# Patient Record
Sex: Female | Born: 1940 | Race: White | Hispanic: No | Smoking: Current every day smoker
Health system: Southern US, Community
[De-identification: ages and names within clinical notes are randomized; demographics above are authoritative.]

## PROBLEM LIST (undated history)

## (undated) DIAGNOSIS — Z8619 Personal history of other infectious and parasitic diseases: Secondary | ICD-10-CM

## (undated) DIAGNOSIS — F419 Anxiety disorder, unspecified: Secondary | ICD-10-CM

## (undated) DIAGNOSIS — Z720 Tobacco use: Secondary | ICD-10-CM

## (undated) DIAGNOSIS — E785 Hyperlipidemia, unspecified: Secondary | ICD-10-CM

## (undated) DIAGNOSIS — F32A Depression, unspecified: Secondary | ICD-10-CM

## (undated) DIAGNOSIS — T7840XA Allergy, unspecified, initial encounter: Secondary | ICD-10-CM

## (undated) DIAGNOSIS — I10 Essential (primary) hypertension: Secondary | ICD-10-CM

## (undated) DIAGNOSIS — F329 Major depressive disorder, single episode, unspecified: Secondary | ICD-10-CM

## (undated) HISTORY — DX: Allergy, unspecified, initial encounter: T78.40XA

## (undated) HISTORY — PX: CARDIAC CATHETERIZATION: SHX172

## (undated) HISTORY — PX: TONSILLECTOMY: SUR1361

## (undated) HISTORY — DX: Hyperlipidemia, unspecified: E78.5

## (undated) HISTORY — PX: OTHER SURGICAL HISTORY: SHX169

## (undated) HISTORY — DX: Essential (primary) hypertension: I10

## (undated) HISTORY — DX: Anxiety disorder, unspecified: F41.9

## (undated) HISTORY — DX: Personal history of other infectious and parasitic diseases: Z86.19

## (undated) HISTORY — DX: Tobacco use: Z72.0

## (undated) HISTORY — DX: Depression, unspecified: F32.A

---
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
## 1976-03-11 HISTORY — PX: ABDOMINAL HYSTERECTOMY: SHX81

## 2002-10-04 DIAGNOSIS — R002 Palpitations: Secondary | ICD-10-CM | POA: Insufficient documentation

## 2002-10-04 DIAGNOSIS — I493 Ventricular premature depolarization: Secondary | ICD-10-CM | POA: Insufficient documentation

## 2002-11-04 DIAGNOSIS — R072 Precordial pain: Secondary | ICD-10-CM | POA: Insufficient documentation

## 2002-11-04 DIAGNOSIS — R9430 Abnormal result of cardiovascular function study, unspecified: Secondary | ICD-10-CM | POA: Insufficient documentation

## 2015-11-16 DIAGNOSIS — F17200 Nicotine dependence, unspecified, uncomplicated: Secondary | ICD-10-CM | POA: Insufficient documentation

## 2017-12-31 LAB — CBC AND DIFFERENTIAL
HCT: 42 (ref 36–46)
Hemoglobin: 13.9 (ref 12.0–16.0)
Platelets: 247 (ref 150–399)
WBC: 9.1

## 2017-12-31 LAB — COMPREHENSIVE METABOLIC PANEL
Albumin: 4.1 (ref 3.5–5.0)
Calcium: 9.6 (ref 8.7–10.7)
GFR calc Af Amer: 58
GFR calc non Af Amer: 50

## 2017-12-31 LAB — HEPATIC FUNCTION PANEL
ALT: 11 (ref 7–35)
AST: 13 (ref 13–35)
Alkaline Phosphatase: 72 (ref 25–125)
Bilirubin, Total: 0.4

## 2017-12-31 LAB — BASIC METABOLIC PANEL
BUN: 8 (ref 4–21)
Creatinine: 1.1 (ref 0.5–1.1)
Glucose: 118
Potassium: 4.4 (ref 3.4–5.3)
Sodium: 138 (ref 137–147)

## 2017-12-31 LAB — LIPID PANEL
Cholesterol: 216 — AB (ref 0–200)
HDL: 35 (ref 35–70)
LDL Cholesterol: 132
Triglycerides: 247 — AB (ref 40–160)

## 2017-12-31 LAB — CBC: RBC: 4.95 (ref 3.87–5.11)

## 2018-01-03 LAB — HEMOGLOBIN A1C: Hemoglobin A1C: 5.9

## 2018-01-05 DIAGNOSIS — R7303 Prediabetes: Secondary | ICD-10-CM | POA: Insufficient documentation

## 2018-04-09 LAB — BASIC METABOLIC PANEL
BUN: 9 (ref 4–21)
Creatinine: 1 (ref 0.5–1.1)
Glucose: 118
Potassium: 4.3 (ref 3.4–5.3)
Sodium: 139 (ref 137–147)

## 2018-04-09 LAB — HEPATIC FUNCTION PANEL
ALT: 13 (ref 7–35)
AST: 15 (ref 13–35)
Alkaline Phosphatase: 69 (ref 25–125)
Bilirubin, Total: 0.4

## 2018-04-09 LAB — HEMOGLOBIN A1C: Hemoglobin A1C: 5.9

## 2018-04-09 LAB — COMPREHENSIVE METABOLIC PANEL
Calcium: 9.8 (ref 8.7–10.7)
GFR calc Af Amer: 65
GFR calc non Af Amer: 56

## 2019-07-05 ENCOUNTER — Encounter: Payer: Self-pay | Admitting: Physician Assistant

## 2019-07-05 ENCOUNTER — Other Ambulatory Visit: Payer: Self-pay

## 2019-07-05 ENCOUNTER — Ambulatory Visit (INDEPENDENT_AMBULATORY_CARE_PROVIDER_SITE_OTHER): Payer: Medicare HMO | Admitting: Physician Assistant

## 2019-07-05 VITALS — BP 140/80 | HR 63 | Temp 97.9°F | Ht 61.5 in | Wt 163.5 lb

## 2019-07-05 DIAGNOSIS — Z720 Tobacco use: Secondary | ICD-10-CM | POA: Insufficient documentation

## 2019-07-05 DIAGNOSIS — F320 Major depressive disorder, single episode, mild: Secondary | ICD-10-CM

## 2019-07-05 DIAGNOSIS — I10 Essential (primary) hypertension: Secondary | ICD-10-CM | POA: Diagnosis not present

## 2019-07-05 DIAGNOSIS — M542 Cervicalgia: Secondary | ICD-10-CM

## 2019-07-05 DIAGNOSIS — F419 Anxiety disorder, unspecified: Secondary | ICD-10-CM | POA: Insufficient documentation

## 2019-07-05 MED ORDER — BUSPIRONE HCL 15 MG PO TABS: ORAL_TABLET | ORAL | 1 refills | Status: DC

## 2019-07-05 MED ORDER — LORAZEPAM 0.5 MG PO TABS: 0.5000 mg | ORAL_TABLET | Freq: Every day | ORAL | 1 refills | Status: DC

## 2019-07-05 NOTE — Progress Notes (Signed)
Sue Little is a 79 y.o. female hereto Establish care.  I acted as a Neurosurgeon for [employer], PA-C Corky Mull, LPN   History of Present Illness:   Chief Complaint  Patient presents with  . Establish Care  . Neck Pain    Neck pain Pt c/o left sided neck pain started 2 days ago off and on. Pt thinks maybe from sleeping the wrong way.  She states that she has had a history of this in the past and she always gets worried that she might have some lymphadenopathy.  She does not take anything for her symptoms.  In the past she was prescribed a muscle relaxer that she never used.  Anxiety/Depression Long history of this.  Currently taking Celexa 40 mg daily, BuSpar 20 mg twice daily and Ativan 0.125 mg daily.  She denies history of suicidal or homicidal ideation.  Her mood has significantly improved since her move from [place].  She has been off of her Ativan once within the past 5 years and she was off of it for almost 8 months but then her mother died.  She has been on Ativan total for at least 2 decades.  HTN Currently taking Toprol  25 mg.  Patient denies chest pain, SOB, blurred vision, dizziness, unusual headaches, lower leg swelling. Patient is compliant with medication. Denies excessive caffeine intake, stimulant usage, excessive alcohol intake, or increase in salt consumption.  BP Readings from Last 3 Encounters:  07/05/19 140/80   Tobacco abuse Started at age 89 y/o. Currently smoking a little less than a pack a day. Tried once in the past to quit, quit cold [place] for a few weeks. She reports that she has had increase in spitting clear sputum. Denies: unintentional weight loss, night sweats, chest pain, bloody sputum, increased cough. Has never been on scheduled inhaler.    Health Maintenance: Immunizations -- UTD per pt will wait for records Colonoscopy -- Never Mammogram -- done 6 yrs ago PAP -- 10 years No hx abnormal pap smears per pt Bone Density -- past 10  yrs Weight -- Weight: 163 lb 8 oz (74.2 kg)   Depression screen PHQ 2/9 07/05/2019  Decreased Interest 1  Down, Depressed, Hopeless 1  PHQ - 2 Score 2  Altered sleeping 2  Tired, decreased energy 1  Change in appetite 2  Feeling bad or failure about yourself  1  Trouble concentrating 2  Moving slowly or fidgety/restless 1  Suicidal thoughts 0  PHQ-9 Score 11  Difficult doing work/chores Somewhat difficult   Other providers/specialists: Patient Care Team: Jarold Motto, [place] as PCP - General (Physician Assistant)   Past Medical History:  Diagnosis Date  . Depression   . History of chicken pox   . Hypertension   . Tobacco abuse    started at age 33 years     Social History   Socioeconomic History  . Marital status: Not on file    Spouse name: Not on file  . Number of children: Not on file  . Years of education: Not on file  . Highest education level: Not on file  Occupational History  . Not on file  Tobacco Use  . Smoking status: Current Every Day Smoker    Packs/day: 1.00    Types: Cigarettes  . Smokeless tobacco: Never Used  Substance and Sexual Activity  . Alcohol use: Never  . Drug use: Not on file  . Sexual activity: Not on file  Other Topics Concern  .  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 Difficulty of Paying Living Expenses:   Food Insecurity:   . Worried About Programme researcher, broadcasting/film/video in the Last Year:   . Barista in the Last Year:   Transportation Needs:   . Freight forwarder (Medical):   Marland Kitchen Lack of Transportation (Non-Medical):   Physical Activity:   . Days of Exercise per Week:   . Minutes of Exercise per Session:   Stress:   . Feeling of Stress :   Social Connections:   .  Frequency of Communication with Friends and Family:   . Frequency of Social Gatherings with Friends and Family:   . Attends Religious Services:   . Active Member of Clubs or Organizations:   . Attends Banker Meetings:   Marland Kitchen Marital Status:   Intimate Partner Violence:   . Fear of Current or Ex-Partner:   . Emotionally Abused:   Marland Kitchen Physically Abused:   . Sexually Abused:     Past Surgical History:  Procedure Laterality Date  . ABDOMINAL HYSTERECTOMY  1978  . CARDIAC CATHETERIZATION    . esohagus stretched    . TONSILLECTOMY      Family History  Problem Relation Age of Onset  . Diabetes Mother   . Heart disease Mother   . Alcohol abuse Father   . Heart attack Father   . Depression Sister   . Lung cancer Maternal Grandfather   . Osteoarthritis Sister   . Depression Sister   . Prostate cancer Brother   . Prostate cancer Brother     Allergies  Allergen Reactions  . Novocain [Procaine] Other (See Comments)    Hypotension, faint  . Ivp Dye [Iodinated Diagnostic Agents] Hives    swelling  . Sulfa Antibiotics Hives     Current Medications:   Current Outpatient Medications:  .  busPIRone (BUSPAR) 15 MG tablet, Take 1 tablet by mouth BID, Disp: 60 tablet, Rfl: 1 .  citalopram (CELEXA) 40 MG tablet, Take 40 mg by mouth daily. , Disp: , Rfl:  .  LORazepam (ATIVAN) 0.5 MG tablet, Take 1 tablet (0.5 mg total) by mouth daily. Pt takes 1/4 of tablet, Disp: 90 tablet, Rfl: 1 .  metoprolol succinate (TOPROL-XL) 25 MG 24 hr tablet, Take 25 mg by mouth daily. , Disp: , Rfl:  .  mineral oil liquid, Take by mouth at bedtime. Pt takes one tablespoon, Disp: , Rfl:    Review of Systems:   ROS  Negative unless otherwise specified per HPI.  Vitals:   Vitals:   07/05/19 1121  BP: 140/80  Pulse: 63  Temp: 97.9 F (36.6 C)  TempSrc: Temporal  SpO2: 97%  Weight: 163 lb 8 oz (74.2 kg)  Height: 5' 1.5" (1.562 m)      Body mass index is 30.39 kg/m.  Physical Exam:    Physical Exam Vitals and nursing note reviewed.  Constitutional:      General: She is not in acute distress.    Appearance: She is well-developed. She is not ill-appearing or toxic-appearing.  Cardiovascular:     Rate and Rhythm: Normal rate and regular rhythm.  Pulses: Normal pulses.     Heart sounds: Normal heart sounds, S1 normal and S2 normal.     Comments: No LE edema Pulmonary:     Effort: Pulmonary effort is normal.     Breath sounds: Normal breath sounds.  Musculoskeletal:     Comments: No decreased ROM 2/2 pain with flexion/extension, lateral side bends, or rotation. No reproducible tenderness with deep palpation to bilateral paraspinal muscles.    Skin:    General: Skin is warm and dry.  Neurological:     Mental Status: She is alert.     GCS: GCS eye subscore is 4. GCS verbal subscore is 5. GCS motor subscore is 6.  Psychiatric:        Speech: Speech normal.        Behavior: Behavior normal. Behavior is cooperative.     No results found for this or any previous visit.  Assessment and Plan:   Shekina was seen today for establish care and neck pain.  Diagnoses and all orders for this visit:  Tobacco abuse Patient is not ready to quit at this time.  Encouraged cessation.  We will continue efforts.  Essential hypertension Currently controlled.  We will continue metoprolol 25 mg extended release daily.  Ideally I would like to change her to a more effective blood pressure medication but will defer any changes at this time.  Anxiety; Depression, major, single episode, mild (HCC) Overall well controlled per patient.  We are going to decrease her BuSpar to 15 mg twice daily.  Continue Celexa.  Continue Ativan, long discussion regarding this.  Patient is aware of long-term risks of this medication and is okay with continuing to take this medication.  I did review the controlled database and there were no red flags.  We are going to follow-up in 1 month regarding  this.  Neck pain No red flags on exam.  Suspect muscle strain. Patient declines any intervention.  May take ibuprofen as needed.    Patient reported several other issues during her visit and I recommended that she follow-up in 1 month to address further issues.   Other orders -     LORazepam (ATIVAN) 0.5 MG tablet; Take 1 tablet (0.5 mg total) by mouth daily. Pt takes 1/4 of tablet -     busPIRone (BUSPAR) 15 MG tablet; Take 1 tablet by mouth BID    Reviewed expectations re: course of current medical issues. Discussed self-management of symptoms. Outlined signs and symptoms indicating need for more acute intervention. Patient verbalized understanding and all questions were answered. See orders for this visit as documented in the electronic medical record. Patient received an After-Visit Summary.  CMA or LPN served as scribe during this visit. History, Physical, and Plan performed by medical provider. The above documentation has been reviewed and is accurate and complete.  I spent 45 minutes with this patient, greater than 50% was face-to-face time counseling regarding the above diagnoses.   Inda Coke, PA-C

## 2019-07-05 NOTE — Patient Instructions (Addendum)
It was great to meet you!  Medication change for today: take 1 tablet of buspar twice a day (stop taking the extra 1/3 tablet)  Consider chewable magnesium and nicotine lozenges.  Follow-up with me in 1 month, sooner if concerns.

## 2019-07-12 ENCOUNTER — Encounter: Payer: Self-pay | Admitting: Physician Assistant

## 2019-07-13 ENCOUNTER — Encounter: Payer: Self-pay | Admitting: Physician Assistant

## 2019-07-13 DIAGNOSIS — I493 Ventricular premature depolarization: Secondary | ICD-10-CM | POA: Insufficient documentation

## 2019-07-13 DIAGNOSIS — E785 Hyperlipidemia, unspecified: Secondary | ICD-10-CM | POA: Insufficient documentation

## 2019-07-27 ENCOUNTER — Telehealth: Payer: Self-pay | Admitting: Physician Assistant

## 2019-07-27 NOTE — Telephone Encounter (Signed)
Ok to place referral for meals on wheels? Please advise.

## 2019-07-27 NOTE — Telephone Encounter (Signed)
Patient calling, she got scratched by a dog and went to the [facility] on [address], They prescribed antibiotics for her arm. Please note. They also recommended her getting a referral for meals on wheels. Please Advise.

## 2019-07-28 ENCOUNTER — Other Ambulatory Visit: Payer: Self-pay

## 2019-07-28 DIAGNOSIS — T730XXA Starvation, initial encounter: Secondary | ICD-10-CM

## 2019-07-28 NOTE — Telephone Encounter (Signed)
Referral placed to [facility] to see if they are able to assist with patient getting Meals on Wheels.

## 2019-07-28 NOTE — Telephone Encounter (Signed)
Try to refer to [facility] for this?

## 2019-08-04 ENCOUNTER — Telehealth: Payer: Self-pay | Admitting: *Deleted

## 2019-08-04 NOTE — Telephone Encounter (Signed)
Spoke to pt's daughter Lanora Manis, told her per Lelon Mast she should call to set-up for Meals on Wheels -- here is the number for [employer] for [employer] -- [phone]. Lanora Manis verbalized understanding and said thank you.

## 2019-08-13 ENCOUNTER — Other Ambulatory Visit: Payer: Self-pay

## 2019-08-16 ENCOUNTER — Ambulatory Visit (INDEPENDENT_AMBULATORY_CARE_PROVIDER_SITE_OTHER): Payer: Medicare HMO | Admitting: Physician Assistant

## 2019-08-16 ENCOUNTER — Encounter: Payer: Self-pay | Admitting: Physician Assistant

## 2019-08-16 ENCOUNTER — Other Ambulatory Visit: Payer: Self-pay

## 2019-08-16 VITALS — BP 128/82 | HR 58 | Temp 97.2°F | Resp 18 | Ht 62.0 in | Wt 162.8 lb

## 2019-08-16 DIAGNOSIS — M25561 Pain in right knee: Secondary | ICD-10-CM

## 2019-08-16 DIAGNOSIS — E785 Hyperlipidemia, unspecified: Secondary | ICD-10-CM | POA: Diagnosis not present

## 2019-08-16 DIAGNOSIS — G8929 Other chronic pain: Secondary | ICD-10-CM

## 2019-08-16 DIAGNOSIS — M25562 Pain in left knee: Secondary | ICD-10-CM

## 2019-08-16 DIAGNOSIS — F419 Anxiety disorder, unspecified: Secondary | ICD-10-CM | POA: Diagnosis not present

## 2019-08-16 DIAGNOSIS — R202 Paresthesia of skin: Secondary | ICD-10-CM

## 2019-08-16 LAB — CBC WITH DIFFERENTIAL/PLATELET
Basophils Absolute: 0.1 10*3/uL (ref 0.0–0.1)
Basophils Relative: 0.7 % (ref 0.0–3.0)
Eosinophils Absolute: 0.3 10*3/uL (ref 0.0–0.7)
Eosinophils Relative: 2.9 % (ref 0.0–5.0)
HCT: 41.1 % (ref 36.0–46.0)
Hemoglobin: 13.8 g/dL (ref 12.0–15.0)
Lymphocytes Relative: 35.3 % (ref 12.0–46.0)
Lymphs Abs: 3.6 10*3/uL (ref 0.7–4.0)
MCHC: 33.6 g/dL (ref 30.0–36.0)
MCV: 85.2 fl (ref 78.0–100.0)
Monocytes Absolute: 0.7 10*3/uL (ref 0.1–1.0)
Monocytes Relative: 7.2 % (ref 3.0–12.0)
Neutro Abs: 5.4 10*3/uL (ref 1.4–7.7)
Neutrophils Relative %: 53.9 % (ref 43.0–77.0)
Platelets: 224 10*3/uL (ref 150.0–400.0)
RBC: 4.82 Mil/uL (ref 3.87–5.11)
RDW: 13.3 % (ref 11.5–15.5)
WBC: 10.1 10*3/uL (ref 4.0–10.5)

## 2019-08-16 LAB — COMPREHENSIVE METABOLIC PANEL
ALT: 13 U/L (ref 0–35)
AST: 13 U/L (ref 0–37)
Albumin: 4.2 g/dL (ref 3.5–5.2)
Alkaline Phosphatase: 72 U/L (ref 39–117)
BUN: 9 mg/dL (ref 6–23)
CO2: 29 mEq/L (ref 19–32)
Calcium: 9.7 mg/dL (ref 8.4–10.5)
Chloride: 105 mEq/L (ref 96–112)
Creatinine, Ser: 0.91 mg/dL (ref 0.40–1.20)
GFR: 59.82 mL/min — ABNORMAL LOW (ref >60.00)
Glucose, Bld: 101 mg/dL — ABNORMAL HIGH (ref 70–99)
Potassium: 4.4 mEq/L (ref 3.5–5.1)
Sodium: 137 mEq/L (ref 135–145)
Total Bilirubin: 0.5 mg/dL (ref 0.2–1.2)
Total Protein: 6.5 g/dL (ref 6.0–8.3)

## 2019-08-16 LAB — LIPID PANEL
Cholesterol: 204 mg/dL — ABNORMAL HIGH (ref 0–200)
HDL: 33.1 mg/dL — ABNORMAL LOW (ref >39.00)
NonHDL: 171.17
Total CHOL/HDL Ratio: 6
Triglycerides: 316 mg/dL — ABNORMAL HIGH (ref 0.0–149.0)
VLDL: 63.2 mg/dL — ABNORMAL HIGH (ref 0.0–40.0)

## 2019-08-16 LAB — VITAMIN B12: Vitamin B-12: 209 pg/mL — ABNORMAL LOW (ref 211–911)

## 2019-08-16 LAB — LDL CHOLESTEROL, DIRECT: Direct LDL: 107 mg/dL

## 2019-08-16 NOTE — Patient Instructions (Signed)
It was great to see you!  I will be in touch with your labs and any recommendations.  You will be contacted about doing physical therapy.  Please work on finding better snacks. Continue Boost.  Keep all medications the same.  Let's follow-up in 3 months, sooner if you have concerns.  Take care,  Jarold Motto PA-C

## 2019-08-16 NOTE — Progress Notes (Signed)
Sue Little is a 79 y.o. female here for a follow up of a pre-existing problem.  History of Present Illness:   Chief Complaint  Patient presents with  . Neck Pain    No pain today, She states that it only hurts when she sleeps the wrong way.   . Hypertension  . Anxiety/Depression    Feels fine.     HPI  Anxiety/depression -- she has been doing well since she last saw me. She has decreased her Buspar to 15 mg BID. She is taking her ativan 0.125 mg daily. She is taking Celexa 40 mg daily. Denies any significant changes to her mood. Denies SI/HI.  Bilateral knee pain/weakness -- having difficulty walking due to bilateral leg pain.  She feels like her knees are causing her these issues -- was recommended to have a L knee replacement in the past but she didn't. Was also told to take Magnesium daily to help with her cramps in her lower legs but she hasn't started this. Feels like her knees are going to give out at times.   HLD --history of elevated lipid panel but has not had a recent panel done.  She was told in the past that she should consider statin but she does not want to take these medications instead she takes omega-3's.  She continues to smoke.  Bilateral paresthesias in fingers --patient reports that she noticed that her middle fourth and fifth fingers on both hands can intermittently become numb and tingle.  Often happens after she uses her iPad for prolonged period of time.  She also has chronic neck pain.  She has not tried anything specific for her symptoms.  She denies weakness.  Past Medical History:  Diagnosis Date  . Anxiety disorder   . Depression   . History of chicken pox   . Hyperlipidemia   . Hypertension   . Tobacco abuse    started at age 74 years     Social History   Tobacco Use  . Smoking status: Current Every Day Smoker    Packs/day: 1.00    Types: Cigarettes  . Smokeless tobacco: Never Used  Substance Use Topics  . Alcohol use: Never  . Drug use: Not  on file    Past Surgical History:  Procedure Laterality Date  . ABDOMINAL HYSTERECTOMY  1978  . CARDIAC CATHETERIZATION    . esohagus stretched    . TONSILLECTOMY      Family History  Problem Relation Age of Onset  . Diabetes Mother   . Heart disease Mother   . Coronary artery disease Mother   . Dementia Mother   . Alcohol abuse Father   . Heart attack Father   . Depression Sister   . Lung cancer Maternal Grandfather   . Osteoarthritis Sister   . Depression Sister   . Prostate cancer Brother   . Prostate cancer Brother     Allergies  Allergen Reactions  . Procaine Other (See Comments) and Hives    Hypotension, faint Hypotension, faint  . Ivp Dye [Iodinated Diagnostic Agents] Hives    swelling  . Sulfa Antibiotics Hives    Current Medications:   Current Outpatient Medications:  .  busPIRone (BUSPAR) 15 MG tablet, Take 1 tablet by mouth BID, Disp: 60 tablet, Rfl: 1 .  citalopram (CELEXA) 40 MG tablet, Take 40 mg by mouth daily. , Disp: , Rfl:  .  LORazepam (ATIVAN) 0.5 MG tablet, Take 1 tablet (0.5 mg total) by mouth daily.  Pt takes 1/4 of tablet, Disp: 90 tablet, Rfl: 1 .  metoprolol succinate (TOPROL-XL) 25 MG 24 hr tablet, Take 25 mg by mouth daily. , Disp: , Rfl:  .  mineral oil liquid, Take by mouth at bedtime. Pt takes one tablespoon, Disp: , Rfl:    Review of Systems:   ROS  Negative unless otherwise specified per HPI.   Vitals:   Vitals:   08/16/19 1117  BP: 128/82  Pulse: (!) 58  Resp: 18  Temp: (!) 97.2 F (36.2 C)  TempSrc: Temporal  SpO2: 96%  Weight: 162 lb 12.8 oz (73.8 kg)  Height: 5\' 2"  (1.575 m)     Body mass index is 29.78 kg/m.  Physical Exam:   Physical Exam Vitals and nursing note reviewed.  Constitutional:      General: She is not in acute distress.    Appearance: She is well-developed. She is not ill-appearing or toxic-appearing.  Cardiovascular:     Rate and Rhythm: Normal rate and regular rhythm.     Pulses: Normal  pulses.          Dorsalis pedis pulses are 2+ on the right side and 2+ on the left side.       Posterior tibial pulses are 2+ on the right side and 2+ on the left side.     Heart sounds: Normal heart sounds, S1 normal and S2 normal.     Comments: No LE edema Pulmonary:     Effort: Pulmonary effort is normal.     Breath sounds: Normal breath sounds.  Musculoskeletal:     Comments: Knee exam: No deformity on inspection. No pain with palpation of knee landmarks. Small amount of swelling noted on bilateral lateral portion of knees. FROM in flex/extension without crepitus.   No calf tenderness/swelling   Skin:    General: Skin is warm and dry.  Neurological:     Mental Status: She is alert.     GCS: GCS eye subscore is 4. GCS verbal subscore is 5. GCS motor subscore is 6.     Comments: Grip strength 5/5  Psychiatric:        Speech: Speech normal.        Behavior: Behavior normal. Behavior is cooperative.       Assessment and Plan:   Sue Little was seen today for neck pain, hypertension and anxiety/depression.  Diagnoses and all orders for this visit:  Paresthesia I suspect that this is either being caused from ulnar nerve compression from prolonged positioning, or possible with her chronic neck pain.  I recommended that she work on better positioning with iPad or books.  If symptoms persist, will either refer to sports medicine or obtain imaging of her neck.  Patient verbalized understanding and is in agreement to plan.  I will updated B12 today. -     Vitamin B12  Hyperlipidemia, unspecified hyperlipidemia type Update lipid panel today.  Patient refuses statins. -     Lipid panel  Anxiety Patient endorses good control denies any concerning symptoms.  Continue current regimen of Celexa 40 mg daily, BuSpar 15 mg twice daily, and Ativan as needed.  Follow-up in 3 months, sooner if concerns. I discussed with patient that if they develop any SI, to tell someone immediately and seek  medical attention.  -     CBC with Differential/Platelet -     Comprehensive metabolic panel  Chronic pain of both knees Patient would like referral for physical therapy to be done at her  home.  I put this referral in.  If symptoms persist and she does not have improvement, will send to sports medicine. -     Ambulatory referral to Home Health  Reviewed expectations re: course of current medical issues. Discussed self-management of symptoms. Outlined signs and symptoms indicating need for more acute intervention. Patient verbalized understanding and all questions were answered. See orders for this visit as documented in the electronic medical record. Patient received an After-Visit Summary.  Jarold Motto, PA-C

## 2019-10-11 ENCOUNTER — Telehealth: Payer: Self-pay | Admitting: Physician Assistant

## 2019-10-11 ENCOUNTER — Telehealth: Payer: Self-pay

## 2019-10-11 ENCOUNTER — Other Ambulatory Visit: Payer: Self-pay | Admitting: Physician Assistant

## 2019-10-11 MED ORDER — CITALOPRAM HYDROBROMIDE 40 MG PO TABS: 40.0000 mg | ORAL_TABLET | Freq: Every day | ORAL | 0 refills | Status: DC

## 2019-10-11 MED ORDER — METOPROLOL SUCCINATE ER 25 MG PO TB24: 25.0000 mg | ORAL_TABLET | Freq: Every day | ORAL | 0 refills | Status: DC

## 2019-10-11 MED ORDER — BUSPIRONE HCL 15 MG PO TABS: ORAL_TABLET | ORAL | 2 refills | Status: DC

## 2019-10-11 NOTE — Telephone Encounter (Signed)
..   LAST APPOINTMENT DATE: 10/11/2019   NEXT APPOINTMENT DATE:@9 /09/2019  MEDICATION:busPIRone (BUSPAR) 15 MG tablet citalopram (CELEXA) 40 MG tablet metoprolol succinate (TOPROL-XL) 25 MG 24 hr tablet        PHARMACY:[id number] - [place], [place] - [address]  **Let patient know to contact pharmacy at the end of the day to make sure medication is ready. **  ** Please notify patient to allow 48-72 hours to process**  **Encourage patient to contact the pharmacy for refills or they can request refills through [facility]**  CLINICAL FILLS OUT ALL BELOW:   LAST REFILL:  QTY:  REFILL DATE:    OTHER COMMENTS:    Okay for refill?  Please advise

## 2019-10-11 NOTE — Telephone Encounter (Signed)
Left message for patient to call back and schedule Medicare Annual Wellness Visit (AWV) either virtually/audio only OR in office. Whatever the patients preference is.  No hx; please schedule at anytime with [facility]-Nurse Health Advisor at [facility].  This should be a 45 minute visit.   

## 2019-10-11 NOTE — Telephone Encounter (Signed)
Rx refills sent to pharmacy. 

## 2019-11-16 ENCOUNTER — Other Ambulatory Visit: Payer: Self-pay

## 2019-11-16 ENCOUNTER — Ambulatory Visit (INDEPENDENT_AMBULATORY_CARE_PROVIDER_SITE_OTHER): Payer: Medicare HMO | Admitting: Physician Assistant

## 2019-11-16 ENCOUNTER — Encounter: Payer: Self-pay | Admitting: Physician Assistant

## 2019-11-16 VITALS — BP 140/82 | HR 52 | Temp 97.8°F | Ht 62.0 in | Wt 160.0 lb

## 2019-11-16 DIAGNOSIS — F320 Major depressive disorder, single episode, mild: Secondary | ICD-10-CM | POA: Diagnosis not present

## 2019-11-16 DIAGNOSIS — F419 Anxiety disorder, unspecified: Secondary | ICD-10-CM

## 2019-11-16 DIAGNOSIS — I10 Essential (primary) hypertension: Secondary | ICD-10-CM | POA: Diagnosis not present

## 2019-11-16 DIAGNOSIS — Z820 Family history of epilepsy and other diseases of the nervous system: Secondary | ICD-10-CM

## 2019-11-16 MED ORDER — LORAZEPAM 0.5 MG PO TABS: 0.5000 mg | ORAL_TABLET | Freq: Every day | ORAL | 1 refills | Status: DC

## 2019-11-16 NOTE — Progress Notes (Signed)
Sue Little is a 79 y.o. female is here to discuss:  I acted as a Neurosurgeon for [employer], PA-C Corky Mull, LPN   History of Present Illness:   Chief Complaint  Patient presents with  . Anxiety    HPI   Anxiety Pt following up, currently taking Buspar 15 mg BID, Celexa 40 mg daily and Lorazepam 0.5 mg, patient taking 1/4 of a tablet daily. She overall feels like she is doing well. She is interested in therapy.  GAD 7 : Generalized Anxiety Score 11/16/2019  Nervous, Anxious, on Edge 1  Control/stop worrying 0  Worry too much - different things 0  Trouble relaxing 0  Restless 0  Easily annoyed or irritable 0  Afraid - awful might happen 1  Total GAD 7 Score 2  Anxiety Difficulty Not difficult at all    HTN Currently taking metoprolol 25 mg extended release daily. At home blood pressure readings are: not checked. Patient denies chest pain, SOB, blurred vision, dizziness, unusual headaches, lower leg swelling. Patient is compliant with medication. Denies excessive caffeine intake, stimulant usage, excessive alcohol intake, or increase in salt consumption.  Memory loss She forgot to add baking powder to recipe and she is having to write her grocery list down more than normal. Her family hasn't noticed anything in regards to her memory. She reports that her mom had dementia and so did her siblings, so she has significant concerns regarding this. Has not forgotten bills or had issues remembering driving directions to places she routinely goes.  BP Readings from Last 3 Encounters:  11/16/19 140/82  08/16/19 128/82  07/05/19 140/80     Health Maintenance Due  Topic Date Due  . Hepatitis C Screening  Never done  . DEXA SCAN  Never done    Past Medical History:  Diagnosis Date  . Anxiety disorder   . Depression   . History of chicken pox   . Hyperlipidemia   . Hypertension   . Tobacco abuse    started at age 27 years     Social History   Tobacco Use  .  Smoking status: Current Every Day Smoker    Packs/day: 1.00    Types: Cigarettes  . Smokeless tobacco: Never Used  Vaping Use  . Vaping Use: Never used  Substance Use Topics  . Alcohol use: Never  . Drug use: Not on file    Past Surgical History:  Procedure Laterality Date  . ABDOMINAL HYSTERECTOMY  1978  . CARDIAC CATHETERIZATION    . esohagus stretched    . TONSILLECTOMY      Family History  Problem Relation Age of Onset  . Diabetes Mother   . Heart disease Mother   . Coronary artery disease Mother   . Dementia Mother   . Alcohol abuse Father   . Heart attack Father   . Depression Sister   . Lung cancer Maternal Grandfather   . Osteoarthritis Sister   . Depression Sister   . Prostate cancer Brother   . Prostate cancer Brother     PMHx, SurgHx, SocialHx, FamHx, Medications, and Allergies were reviewed in the Visit Navigator and updated as appropriate.   Patient Active Problem List   Diagnosis Date Noted  . PVC (premature ventricular contraction) 07/13/2019  . Hyperlipidemia 07/13/2019  . Tobacco abuse 07/05/2019  . Essential hypertension 07/05/2019  . Anxiety 07/05/2019  . Neck pain 07/05/2019  . Depression, major, single episode, mild (HCC) 07/05/2019    Social History  Tobacco Use  . Smoking status: Current Every Day Smoker    Packs/day: 1.00    Types: Cigarettes  . Smokeless tobacco: Never Used  Vaping Use  . Vaping Use: Never used  Substance Use Topics  . Alcohol use: Never  . Drug use: Not on file    Current Medications and Allergies:    Current Outpatient Medications:  .  aspirin EC 81 MG tablet, Take 81 mg by mouth daily. Swallow whole., Disp: , Rfl:  .  busPIRone (BUSPAR) 15 MG tablet, Take 1 tablet by mouth BID, Disp: 60 tablet, Rfl: 2 .  citalopram (CELEXA) 40 MG tablet, Take 1 tablet (40 mg total) by mouth daily., Disp: 90 tablet, Rfl: 0 .  LORazepam (ATIVAN) 0.5 MG tablet, Take 1 tablet (0.5 mg total) by mouth daily. Pt takes 1/4 of  tablet, Disp: 90 tablet, Rfl: 1 .  Methylcobalamin 5000 MCG TBDP, Take 1 tablet by mouth daily., Disp: , Rfl:  .  metoprolol succinate (TOPROL-XL) 25 MG 24 hr tablet, Take 1 tablet (25 mg total) by mouth daily., Disp: 90 tablet, Rfl: 0 .  mineral oil liquid, Take by mouth at bedtime. Pt takes one tablespoon, Disp: , Rfl:  .  Omega-3 Fatty Acids (FISH OIL PO), Take 5 mLs by mouth daily., Disp: , Rfl:    Allergies  Allergen Reactions  . Procaine Other (See Comments) and Hives    Hypotension, faint Hypotension, faint  . Ivp Dye [Iodinated Diagnostic Agents] Hives    swelling  . Sulfa Antibiotics Hives    Review of Systems   ROS Negative unless otherwise specified per HPI.  Vitals:   Vitals:   11/16/19 1034 11/16/19 1125  BP: (!) 160/84 140/82  Pulse: 62 (!) 52  Temp: 97.8 F (36.6 C)   TempSrc: Temporal   SpO2: 97%   Weight: 160 lb (72.6 kg)   Height: 5\' 2"  (1.575 m)      Body mass index is 29.26 kg/m.   Physical Exam:    Physical Exam Vitals and nursing note reviewed.  Constitutional:      General: She is not in acute distress.    Appearance: She is well-developed. She is not ill-appearing or toxic-appearing.  HENT:     Head: Normocephalic and atraumatic.  Cardiovascular:     Rate and Rhythm: Normal rate and regular rhythm.     Heart sounds: Normal heart sounds.  Pulmonary:     Effort: Pulmonary effort is normal. No accessory muscle usage or respiratory distress.     Breath sounds: Normal breath sounds.  Skin:    General: Skin is warm and dry.  Neurological:     Mental Status: She is alert.  Psychiatric:        Speech: Speech normal.        Behavior: Behavior is cooperative.      Assessment and Plan:    Zaliah was seen today for anxiety.  Diagnoses and all orders for this visit:  Depression, major, single episode, mild (HCC); Anxiety Overall well controlled with current medication regimen, however we discussed the option of adding talk therapy and  she was interested in this. Will put in referral. Follow-up in 3 months for medication. -     Ambulatory referral to Psychology  Essential hypertension Initially elevated but was improved to 140/82 on re-check. Continue current regimen and follow-up in 3 months.  Family history of memory loss Discussed warning signs of worsening memory loss and reviewed red flags with patient.  Discussed that what she is describing to me seems normal for her age, however I did offer neuro referral given her family history, she declined. Continue to monitor and encouraged follow-up if symptoms change/worsen.  Reviewed expectations re: course of current medical issues. Discussed self-management of symptoms. Outlined signs and symptoms indicating need for more acute intervention. Patient verbalized understanding and all questions were answered. See orders for this visit as documented in the electronic medical record. Patient received an After Visit Summary.  CMA or LPN served as scribe during this visit. History, Physical, and Plan performed by medical provider. The above documentation has been reviewed and is accurate and complete.  Jarold Motto, PA-C [place] 11/16/2019  Follow-up: No follow-ups on file.

## 2019-11-16 NOTE — Patient Instructions (Addendum)
It was great to see you!  For your blood pressure -- please purchase a blood pressure cuff.  And check your blood pressure periodically, if it is >150/90 consistently come see me.  Continue current medications as prescribed.  Will put in a referral for you to talk to a therapist.  Please follow-up in 3 months, sooner if concerns.  Take care,  Jarold Motto PA-C

## 2019-12-02 ENCOUNTER — Telehealth: Payer: Self-pay

## 2019-12-02 NOTE — Telephone Encounter (Signed)
Please see message and advise 

## 2019-12-02 NOTE — Telephone Encounter (Signed)
Pt wanted to inform Sue Little that her blood pressure numbers are ranging from 130s to 162s over 75-80. Pt is concerned and wants samantha to know. When she wakes up is when its lower Then, once she drinks her coffee and gets nervous the highest its been is 162/82. Pt wants to know if she needs a med change.

## 2019-12-03 NOTE — Telephone Encounter (Signed)
Scheduled appt with pt  

## 2019-12-03 NOTE — Telephone Encounter (Signed)
Patient needs an appointment to discuss 

## 2019-12-03 NOTE — Telephone Encounter (Signed)
Pt is scheduled °

## 2019-12-07 ENCOUNTER — Ambulatory Visit (INDEPENDENT_AMBULATORY_CARE_PROVIDER_SITE_OTHER): Payer: Medicare HMO | Admitting: Physician Assistant

## 2019-12-07 ENCOUNTER — Other Ambulatory Visit: Payer: Self-pay

## 2019-12-07 ENCOUNTER — Encounter: Payer: Self-pay | Admitting: Physician Assistant

## 2019-12-07 VITALS — BP 120/62 | HR 66 | Temp 98.0°F | Ht 62.0 in | Wt 160.2 lb

## 2019-12-07 DIAGNOSIS — Z23 Encounter for immunization: Secondary | ICD-10-CM | POA: Diagnosis not present

## 2019-12-07 DIAGNOSIS — S81011A Laceration without foreign body, right knee, initial encounter: Secondary | ICD-10-CM | POA: Diagnosis not present

## 2019-12-07 DIAGNOSIS — I10 Essential (primary) hypertension: Secondary | ICD-10-CM | POA: Diagnosis not present

## 2019-12-07 DIAGNOSIS — S81012A Laceration without foreign body, left knee, initial encounter: Secondary | ICD-10-CM | POA: Diagnosis not present

## 2019-12-07 NOTE — Patient Instructions (Signed)
It was great to see you!  No changes to blood pressure medication today.  Keep area covered, change bandage daily.  Apply bacitracin (ointment that we gave you) to area when you change bandage.  If any concerns for infection or worsening pain, please let us know.  May take ibuprofen for pain or swelling.   Take care,  Jarold Motto PA-C

## 2019-12-07 NOTE — Progress Notes (Addendum)
Sue Little is a 79 y.o. female is here for follow up.  I acted as a Neurosurgeon for [employer], PA-C Corky Mull, LPN   History of Present Illness:   Chief Complaint  Patient presents with  . Fall  . Hypertension    HPI   Fall; Bilateral knee skin tears Pt fell this morning on the sidewalk. Injured both knees. Left knee is bleeding, pt just put bandages on but did not clean wounds. Bilateral knees have been bleeding. No significant pain. She is able to walk without concerns. Denies: fevers, chills.  She is not UTD on tetanus.  Hypertension Pt following up, currently taking Metoprolol 25 mg daily. Pt denies headaches, dizziness, blurred vision, chest pain, SOB or lower leg edema. Denies excessive caffeine intake, stimulant usage, excessive alcohol intake or increase in salt consumption.   Health Maintenance Due  Topic Date Due  . Hepatitis C Screening  Never done  . DEXA SCAN  Never done    Past Medical History:  Diagnosis Date  . Anxiety disorder   . Depression   . History of chicken pox   . Hyperlipidemia   . Hypertension   . Tobacco abuse    started at age 14 years     Social History   Tobacco Use  . Smoking status: Current Every Day Smoker    Packs/day: 1.00    Types: Cigarettes  . Smokeless tobacco: Never Used  Vaping Use  . Vaping Use: Never used  Substance Use Topics  . Alcohol use: Never  . Drug use: Not on file    Past Surgical History:  Procedure Laterality Date  . ABDOMINAL HYSTERECTOMY  1978  . CARDIAC CATHETERIZATION    . esohagus stretched    . TONSILLECTOMY      Family History  Problem Relation Age of Onset  . Diabetes Mother   . Heart disease Mother   . Coronary artery disease Mother   . Dementia Mother   . Alcohol abuse Father   . Heart attack Father   . Depression Sister   . Lung cancer Maternal Grandfather   . Osteoarthritis Sister   . Depression Sister   . Prostate cancer Brother   . Prostate cancer Brother      PMHx, SurgHx, SocialHx, FamHx, Medications, and Allergies were reviewed in the Visit Navigator and updated as appropriate.   Patient Active Problem List   Diagnosis Date Noted  . PVC (premature ventricular contraction) 07/13/2019  . Hyperlipidemia 07/13/2019  . Tobacco abuse 07/05/2019  . Essential hypertension 07/05/2019  . Anxiety 07/05/2019  . Neck pain 07/05/2019  . Depression, major, single episode, mild (HCC) 07/05/2019    Social History   Tobacco Use  . Smoking status: Current Every Day Smoker    Packs/day: 1.00    Types: Cigarettes  . Smokeless tobacco: Never Used  Vaping Use  . Vaping Use: Never used  Substance Use Topics  . Alcohol use: Never  . Drug use: Not on file    Current Medications and Allergies:    Current Outpatient Medications:  .  aspirin EC 81 MG tablet, Take 81 mg by mouth daily. Swallow whole., Disp: , Rfl:  .  busPIRone (BUSPAR) 15 MG tablet, Take 1 tablet by mouth BID, Disp: 60 tablet, Rfl: 2 .  citalopram (CELEXA) 40 MG tablet, Take 1 tablet (40 mg total) by mouth daily., Disp: 90 tablet, Rfl: 0 .  LORazepam (ATIVAN) 0.5 MG tablet, Take 1 tablet (0.5 mg total) by  mouth daily. Pt takes 1/4 of tablet, Disp: 30 tablet, Rfl: 1 .  Methylcobalamin 5000 MCG TBDP, Take 1 tablet by mouth daily., Disp: , Rfl:  .  metoprolol succinate (TOPROL-XL) 25 MG 24 hr tablet, Take 1 tablet (25 mg total) by mouth daily., Disp: 90 tablet, Rfl: 0 .  mineral oil liquid, Take by mouth at bedtime. Pt takes one tablespoon, Disp: , Rfl:  .  Omega-3 Fatty Acids (FISH OIL PO), Take 5 mLs by mouth daily., Disp: , Rfl:    Allergies  Allergen Reactions  . Procaine Other (See Comments) and Hives    Hypotension, faint Hypotension, faint  . Ivp Dye [Iodinated Diagnostic Agents] Hives    swelling  . Sulfa Antibiotics Hives    Review of Systems   ROS  Negative unless otherwise specified per HPI.  Vitals:   Vitals:   12/07/19 1340  BP: 120/62  Pulse: 66  Temp:  98 F (36.7 C)  TempSrc: Temporal  SpO2: 97%  Weight: 160 lb 4 oz (72.7 kg)  Height: 5\' 2"  (1.575 m)     Body mass index is 29.31 kg/m.   Physical Exam:    Physical Exam Vitals and nursing note reviewed.  Constitutional:      General: She is not in acute distress.    Appearance: She is well-developed. She is not ill-appearing or toxic-appearing.  Cardiovascular:     Rate and Rhythm: Normal rate and regular rhythm.     Pulses: Normal pulses.     Heart sounds: Normal heart sounds, S1 normal and S2 normal.     Comments: No LE edema Pulmonary:     Effort: Pulmonary effort is normal.     Breath sounds: Normal breath sounds.  Musculoskeletal:     Comments: Normal ROM of bilateral knees; able to bear weight without significant issues; normal gait; very mild generalized swelling to b/l patella  Skin:    General: Skin is warm and dry.     Comments: L anterior Knee: 3-cm superficial skin tear with irregular edges; slight bleeding present  R anterior knee: 1-cm superficial skin tear with well approximated edges; no bleeding or discharge present  Neurological:     Mental Status: She is alert.     GCS: GCS eye subscore is 4. GCS verbal subscore is 5. GCS motor subscore is 6.  Psychiatric:        Speech: Speech normal.        Behavior: Behavior normal. Behavior is cooperative.    Procedure note: repair of wounds Patient consented to procedure verbally after risks were discussed. Bilateral knees cleaned with sterile saline Dermabond applied to L knee edges to skin tear borders Bacitracin applied Area bandaged Patient tolerated procedure well  Tetanus updated   Assessment and Plan:    Sue Little was seen today for fall and hypertension.  Diagnoses and all orders for this visit:  Essential hypertension Well controlled in office today. Continue metoprolol 25 mg XR daily. Follow-up in 3 months, sooner if concerns.  Laceration of skin of left knee, initial encounter;  Laceration of skin of right knee, initial encounter No evidence of infection or excessive bleeding. Update tetanus today. Aftercare instructions provided. Follow-up if concerns for infection. Did discuss that knee pain may worsen and low threshold to get a knee xray. Patient verbalized understanding to plan and in agreement.  CMA or LPN served as scribe during this visit. History, Physical, and Plan performed by medical provider. The above documentation has been reviewed and  is accurate and complete.  Jarold Motto, PA-C [place] 12/07/2019  Follow-up: No follow-ups on file.

## 2019-12-07 NOTE — Addendum Note (Signed)
Addended by: Jimmye Norman on: 12/07/2019 04:40 PM   Modules accepted: Orders

## 2019-12-08 ENCOUNTER — Ambulatory Visit (INDEPENDENT_AMBULATORY_CARE_PROVIDER_SITE_OTHER): Payer: Medicare HMO | Admitting: Psychologist

## 2019-12-08 DIAGNOSIS — F320 Major depressive disorder, single episode, mild: Secondary | ICD-10-CM

## 2019-12-10 ENCOUNTER — Ambulatory Visit: Payer: Medicare HMO | Admitting: Psychologist

## 2019-12-14 ENCOUNTER — Ambulatory Visit (INDEPENDENT_AMBULATORY_CARE_PROVIDER_SITE_OTHER): Payer: Medicare HMO | Admitting: Psychologist

## 2019-12-14 DIAGNOSIS — F320 Major depressive disorder, single episode, mild: Secondary | ICD-10-CM

## 2019-12-21 ENCOUNTER — Ambulatory Visit (INDEPENDENT_AMBULATORY_CARE_PROVIDER_SITE_OTHER): Payer: Medicare HMO | Admitting: Psychologist

## 2019-12-21 DIAGNOSIS — F320 Major depressive disorder, single episode, mild: Secondary | ICD-10-CM | POA: Diagnosis not present

## 2019-12-28 ENCOUNTER — Ambulatory Visit (INDEPENDENT_AMBULATORY_CARE_PROVIDER_SITE_OTHER): Payer: Medicare HMO | Admitting: Psychologist

## 2019-12-28 DIAGNOSIS — F320 Major depressive disorder, single episode, mild: Secondary | ICD-10-CM | POA: Diagnosis not present

## 2019-12-31 ENCOUNTER — Other Ambulatory Visit: Payer: Self-pay | Admitting: *Deleted

## 2019-12-31 MED ORDER — CITALOPRAM HYDROBROMIDE 40 MG PO TABS: 40.0000 mg | ORAL_TABLET | Freq: Every day | ORAL | 0 refills | Status: DC

## 2019-12-31 MED ORDER — METOPROLOL SUCCINATE ER 25 MG PO TB24: 25.0000 mg | ORAL_TABLET | Freq: Every day | ORAL | 1 refills | Status: DC

## 2019-12-31 MED ORDER — LORAZEPAM 0.5 MG PO TABS: 0.5000 mg | ORAL_TABLET | Freq: Every day | ORAL | 0 refills | Status: DC

## 2019-12-31 MED ORDER — BUSPIRONE HCL 15 MG PO TABS: ORAL_TABLET | ORAL | 1 refills | Status: DC

## 2019-12-31 NOTE — Telephone Encounter (Signed)
Pt requesting refills be sent to mail order

## 2020-01-04 ENCOUNTER — Other Ambulatory Visit: Payer: Self-pay | Admitting: Physician Assistant

## 2020-01-05 ENCOUNTER — Ambulatory Visit: Payer: Medicare HMO | Admitting: Psychologist

## 2020-01-08 ENCOUNTER — Other Ambulatory Visit: Payer: Self-pay | Admitting: Physician Assistant

## 2020-01-10 ENCOUNTER — Telehealth: Payer: Self-pay

## 2020-01-10 MED ORDER — LORAZEPAM 0.5 MG PO TABS
ORAL_TABLET | ORAL | 2 refills | Status: DC
Start: 2020-01-10 — End: 2020-05-31

## 2020-01-10 NOTE — Telephone Encounter (Signed)
Spoke to pt told her need to clarify dose of her Lorazepam. Asked pt what she is taking? Pt said [employer] just called her and she is using one tablet a day as needed. Pt said it depends on the day, she does not take a whole tablet at once, she cuts it and probably takes 1/4 at a time. Told pt okay I will let [employer] know. Pt verbalized understanding.

## 2020-01-10 NOTE — Telephone Encounter (Signed)
Sue Little is calling in from [facility] calling for verification on LORazepam (ATIVAN) 0.5 MG tablet as there is two sets of directions.

## 2020-01-10 NOTE — Telephone Encounter (Signed)
The [facility] and spoke to [place], clarified Lorazepam. Told him pt takes 1/4 tablet at a time as needed but wants it kept as one tablet daily . Sue Little said that is fine we can say pt to take 1/4 tablet to one tablet daily prn. Told him okay that is fine.

## 2020-01-10 NOTE — Addendum Note (Signed)
Addended by: Jimmye Norman on: 01/10/2020 12:40 PM   Modules accepted: Orders

## 2020-01-25 ENCOUNTER — Ambulatory Visit (INDEPENDENT_AMBULATORY_CARE_PROVIDER_SITE_OTHER): Payer: Medicare HMO | Admitting: Psychologist

## 2020-01-25 DIAGNOSIS — F320 Major depressive disorder, single episode, mild: Secondary | ICD-10-CM

## 2020-02-08 ENCOUNTER — Ambulatory Visit (INDEPENDENT_AMBULATORY_CARE_PROVIDER_SITE_OTHER): Payer: Medicare HMO | Admitting: Psychologist

## 2020-02-08 DIAGNOSIS — F320 Major depressive disorder, single episode, mild: Secondary | ICD-10-CM

## 2020-02-15 ENCOUNTER — Other Ambulatory Visit: Payer: Self-pay | Admitting: Physician Assistant

## 2020-02-15 ENCOUNTER — Ambulatory Visit (INDEPENDENT_AMBULATORY_CARE_PROVIDER_SITE_OTHER): Payer: Medicare HMO | Admitting: Physician Assistant

## 2020-02-15 ENCOUNTER — Encounter: Payer: Self-pay | Admitting: Physician Assistant

## 2020-02-15 ENCOUNTER — Other Ambulatory Visit: Payer: Self-pay

## 2020-02-15 ENCOUNTER — Telehealth: Payer: Self-pay

## 2020-02-15 VITALS — BP 140/78 | HR 68 | Temp 97.8°F | Ht 62.0 in | Wt 162.0 lb

## 2020-02-15 DIAGNOSIS — Z720 Tobacco use: Secondary | ICD-10-CM

## 2020-02-15 DIAGNOSIS — M542 Cervicalgia: Secondary | ICD-10-CM

## 2020-02-15 DIAGNOSIS — F419 Anxiety disorder, unspecified: Secondary | ICD-10-CM

## 2020-02-15 DIAGNOSIS — I10 Essential (primary) hypertension: Secondary | ICD-10-CM

## 2020-02-15 DIAGNOSIS — K5900 Constipation, unspecified: Secondary | ICD-10-CM

## 2020-02-15 DIAGNOSIS — M79662 Pain in left lower leg: Secondary | ICD-10-CM

## 2020-02-15 DIAGNOSIS — N907 Vulvar cyst: Secondary | ICD-10-CM

## 2020-02-15 DIAGNOSIS — R0981 Nasal congestion: Secondary | ICD-10-CM

## 2020-02-15 MED ORDER — IPRATROPIUM BROMIDE 0.03 % NA SOLN: 2.0000 | Freq: Two times a day (BID) | NASAL | 12 refills | Status: DC

## 2020-02-15 MED ORDER — MUPIROCIN CALCIUM 2 % EX CREA: TOPICAL_CREAM | CUTANEOUS | 0 refills | Status: DC

## 2020-02-15 NOTE — Progress Notes (Signed)
Sue DuncansBrenda Little is a 79 y.o. female is here for follow up.  I acted as a Neurosurgeonscribe for [employer] CorporationSamantha Keyleen Cerrato, PA-C Corky Mullonna Orphanos, LPN  History of Present Illness:   Chief Complaint  Patient presents with  . Hypertension  . Recurrent Skin Infections    HPI   Hypertension Pt following up today, currently taking Metoprolol XL 25 mg daily. Pt denies headaches, dizziness, blurred vision, chest pain, SOB or lower leg edema. Denies excessive caffeine intake, stimulant usage, excessive alcohol intake or increase in salt consumption.  BP Readings from Last 3 Encounters:  02/15/20 140/78  12/07/19 120/62  11/16/19 140/82   Labial cyst Pt c/o boil right labia x 1 week, has drained some and gone down in size. She has been putting Neosporin on it. Denies: fevers, chills, nausea, malaise.  Constipation Ongoing issue. Takes mineral oil prn. Has issues with "my whole body shaking" to push out large, hard bowel movements. Has blood when she wipes. Has history of large hemorrhoids. Had a "partial colonoscopy" several years ago. Refuses to have colonoscopy, is too afraid of "dying on the table."  L lower leg pain Had a fall in September and had about two months of recovery in regards to knee pain, swelling, lower leg pain. Went to PT. Still has lingering dull achy pain in L lower leg with intermittent swelling. Denies chest pain, SOB, palpitations.  Nasal congestion Patient reports issues with nasal congestion and excessive mucus production in her airway since a trip to [place] earlier this fall.  She has tried nasal saline and an expired prior prescription nasal spray with mild relief of symptoms.  She is having some postnasal drip that is producing the cough.  She is a current smoker.  Denies: Shortness of breath, productive cough, chest pain, shortness of breath, wheezing.  Anxiety She is currently prescribed Celexa 40 mg daily, he is back 15 mg twice daily, and Ativan 0.5 mg daily.  She is seeing a  therapist that she is enjoying.  Denies any suicidal or homicidal ideation.  Neck pain Intermittently gets issues with neck pain on the left side when she sleeps the wrong way.  Denies any significant stiffness, unusual headaches or numbness or tingling.  She is wondering what she can take for this as needed.  Tobacco abuse She has been smoking since age 79.  She smokes a pack per day.  She is interested in pursuing low-dose lung cancer screening.  Denies: Hemoptysis, unintentional weight loss, night sweats, unusual pain.   Health Maintenance Due  Topic Date Due  . Hepatitis C Screening  Never done  . DEXA SCAN  Never done    Past Medical History:  Diagnosis Date  . Anxiety disorder   . Depression   . History of chicken pox   . Hyperlipidemia   . Hypertension   . Tobacco abuse    started at age 79 years     Social History   Tobacco Use  . Smoking status: Current Every Day Smoker    Packs/day: 1.00    Types: Cigarettes    Start date: 03/11/1960  . Smokeless tobacco: Never Used  Vaping Use  . Vaping Use: Never used  Substance Use Topics  . Alcohol use: Never  . Drug use: Not on file    Past Surgical History:  Procedure Laterality Date  . ABDOMINAL HYSTERECTOMY  1978  . CARDIAC CATHETERIZATION    . esohagus stretched    . TONSILLECTOMY  Family History  Problem Relation Age of Onset  . Diabetes Mother   . Heart disease Mother   . Coronary artery disease Mother   . Dementia Mother   . Alcohol abuse Father   . Heart attack Father   . Depression Sister   . Lung cancer Maternal Grandfather   . Osteoarthritis Sister   . Depression Sister   . Prostate cancer Brother   . Prostate cancer Brother     PMHx, SurgHx, SocialHx, FamHx, Medications, and Allergies were reviewed in the Visit Navigator and updated as appropriate.   Patient Active Problem List   Diagnosis Date Noted  . PVC (premature ventricular contraction) 07/13/2019  . Hyperlipidemia 07/13/2019  .  Tobacco abuse 07/05/2019  . Essential hypertension 07/05/2019  . Anxiety 07/05/2019  . Neck pain 07/05/2019  . Depression, major, single episode, mild (HCC) 07/05/2019    Social History   Tobacco Use  . Smoking status: Current Every Day Smoker    Packs/day: 1.00    Types: Cigarettes    Start date: 03/11/1960  . Smokeless tobacco: Never Used  Vaping Use  . Vaping Use: Never used  Substance Use Topics  . Alcohol use: Never  . Drug use: Not on file    Current Medications and Allergies:    Current Outpatient Medications:  .  aspirin EC 81 MG tablet, Take 81 mg by mouth daily. Swallow whole., Disp: , Rfl:  .  busPIRone (BUSPAR) 15 MG tablet, Take 1 tablet by mouth BID, Disp: 180 tablet, Rfl: 1 .  citalopram (CELEXA) 40 MG tablet, TAKE 1 TABLET BY MOUTH EVERY DAY, Disp: 90 tablet, Rfl: 0 .  LORazepam (ATIVAN) 0.5 MG tablet, Pt to take 1/4 tablet to 1 tablet daily as needed., Disp: 30 tablet, Rfl: 2 .  Methylcobalamin 5000 MCG TBDP, Take 1 tablet by mouth daily., Disp: , Rfl:  .  metoprolol succinate (TOPROL-XL) 25 MG 24 hr tablet, TAKE 1 TABLET BY MOUTH EVERY DAY, Disp: 90 tablet, Rfl: 1 .  mineral oil liquid, Take by mouth at bedtime. Pt takes one tablespoon, Disp: , Rfl:  .  Omega-3 Fatty Acids (FISH OIL PO), Take 5 mLs by mouth daily., Disp: , Rfl:  .  mupirocin cream (BACTROBAN) 2 %, Apply to affected area 1-2 times daily, Disp: 15 g, Rfl: 0   Allergies  Allergen Reactions  . Procaine Other (See Comments) and Hives    Hypotension, faint Hypotension, faint  . Ivp Dye [Iodinated Diagnostic Agents] Hives    swelling  . Sulfa Antibiotics Hives    Review of Systems   ROS Negative unless otherwise specified per HPI.  Vitals:   Vitals:   02/15/20 1056  BP: 140/78  Pulse: 68  Temp: 97.8 F (36.6 C)  TempSrc: Temporal  SpO2: 97%  Weight: 162 lb (73.5 kg)  Height: 5\' 2"  (1.575 m)     Body mass index is 29.63 kg/m.   Physical Exam:    Physical Exam Vitals and  nursing note reviewed.  Constitutional:      General: She is not in acute distress.    Appearance: She is well-developed. She is not ill-appearing or toxic-appearing.  HENT:     Head: Normocephalic and atraumatic.     Right Ear: Tympanic membrane, ear canal and external ear normal. Tympanic membrane is not erythematous, retracted or bulging.     Left Ear: Tympanic membrane, ear canal and external ear normal. Tympanic membrane is not erythematous, retracted or bulging.  Nose: Congestion present.     Right Sinus: No maxillary sinus tenderness or frontal sinus tenderness.     Left Sinus: No maxillary sinus tenderness or frontal sinus tenderness.     Mouth/Throat:     Pharynx: Uvula midline. No posterior oropharyngeal erythema.  Eyes:     General: Lids are normal.     Conjunctiva/sclera: Conjunctivae normal.  Neck:     Trachea: Trachea normal.  Cardiovascular:     Rate and Rhythm: Normal rate and regular rhythm.     Pulses: Normal pulses.     Heart sounds: Normal heart sounds, S1 normal and S2 normal.     Comments: No LE edema Pulmonary:     Effort: Pulmonary effort is normal.     Breath sounds: Normal breath sounds. No decreased breath sounds, wheezing, rhonchi or rales.  Genitourinary:    Comments: <1/2 cm cyst on R labia  Large external hemorrhoids Musculoskeletal:     Comments: Lateral mid R LE with TTP and slight swelling No calf tenderness  Lymphadenopathy:     Cervical: No cervical adenopathy.  Skin:    General: Skin is warm and dry.  Neurological:     Mental Status: She is alert.     GCS: GCS eye subscore is 4. GCS verbal subscore is 5. GCS motor subscore is 6.  Psychiatric:        Speech: Speech normal.        Behavior: Behavior normal. Behavior is cooperative.      Assessment and Plan:    Telicia was seen today for hypertension and recurrent skin infections.  Diagnoses and all orders for this visit:  Essential hypertension Well-controlled. Toprol-XL 25  mg daily.   Follow-up in 3 to 4 months, sooner if concerns.  Labial cyst Appears to be healing. We briefly discussed trialing an oral antibiotic, however she cannot take hardly antibiotics without doing of the version. She would like to trial a topical antibiotic instead. I think this is reasonable since it appears to be healing well, I have prescribed Bactroban for her to use twice daily. I have asked patient to call us and we will send in an oral antibiotic.  Constipation, unspecified constipation type Encouraged colon cancer screening but she refused. Handout provided constipation. I recommended that she start daily MiraLAX and daily Colace. Follow-up in 3 to 4 months, sooner if concerns.  Pain of left lower leg Possible muscle strain but given recent injury and increased sedentary nature due to injury, will obtain ultrasound for further evaluation and management. -     VAS [place] LOWER EXTREMITY VENOUS (DVT); Future  Nasal congestion No evidence of infection on my exam today. Will trial Atrovent nasal spray. Follow-up if any concerns.  Anxiety Currently well controlled with Ativan 0.5 mg daily, Celexa 40 mg daily, BuSpar 15 mg twice daily. Continue talk therapy. Follow-up in 3 to 4 months, sooner if concerns.  Neck pain Asymptomatic today. Recommend she trial topical diclofenac for as needed pain. If symptoms are not managed with this, will trial physical therapy.  Tobacco abuse Encourage cessation. We will also put in referral to see if she will meet criteria for low-dose CT lung cancer screening. -     Ambulatory referral to Pulmonology  Other orders -     mupirocin cream (BACTROBAN) 2 %; Apply to affected area 1-2 times daily   Time spent with patient today was >55 minutes which consisted of chart review, discussing diagnosis, work up, treatment answering questions and  documentation.   Jarold Motto, PA-C [place] 02/15/2020  Follow-up: No  follow-ups on file.

## 2020-02-15 NOTE — Patient Instructions (Signed)
It was great to see you!  For your bowels: Capful of miralax DAILY Stool softener (colace) 100 mg DAILY  For your cyst: Topical bactroban, if worsening symptoms, let me know  For your sinuses: Nasal saline and then presription nasal spray that I have sent in  For your smoking: You will be contacted about scheduling a low-dose CT scan  For your neck: Voltaren Gel is available over the counter.   You are to apply this gel to your injured body part twice daily (morning and evening).   A little goes a long way so you can use about a pea-sized amount for each area.  ? Spread this small amount over the area into a thin film and let it dry.  ? Be sure that you do not rub the gel into your skin for more than 10 or 15 seconds otherwise it can irritate you skin.   ? Once you apply the gel, please do not put any other lotion or clothing in contact with that area for 30 minutes to allow the gel to absorb into your skin.   Some people are sensitive to the medication and can develop a sunburn-like rash.  If you have only mild symptoms it is okay to continue to use the medication but if you have any breakdown of your skin you should discontinue its use and please let us know.      Let's follow-up in 3-4 months, sooner if you have concerns.    Take care,  Jarold Motto PA-C

## 2020-02-15 NOTE — Telephone Encounter (Signed)
Patient wanted Jarold Motto  to know who her therapist is.    States she sees Warehouse manager with [employer].

## 2020-02-15 NOTE — Telephone Encounter (Signed)
Please see message. °

## 2020-02-17 ENCOUNTER — Ambulatory Visit (INDEPENDENT_AMBULATORY_CARE_PROVIDER_SITE_OTHER): Payer: Medicare HMO | Admitting: Psychologist

## 2020-02-17 DIAGNOSIS — F320 Major depressive disorder, single episode, mild: Secondary | ICD-10-CM | POA: Diagnosis not present

## 2020-02-18 ENCOUNTER — Ambulatory Visit (HOSPITAL_COMMUNITY)
Admission: RE | Admit: 2020-02-18 | Discharge: 2020-02-18 | Disposition: A | Discharge status: Home / Self Care | Payer: Medicare HMO | Source: Ambulatory Visit | Attending: Cardiovascular Disease | Admitting: Cardiovascular Disease

## 2020-02-18 ENCOUNTER — Other Ambulatory Visit: Payer: Self-pay

## 2020-02-18 DIAGNOSIS — M79662 Pain in left lower leg: Secondary | ICD-10-CM | POA: Insufficient documentation

## 2020-02-23 ENCOUNTER — Ambulatory Visit (INDEPENDENT_AMBULATORY_CARE_PROVIDER_SITE_OTHER): Payer: Medicare HMO | Admitting: Psychologist

## 2020-02-23 DIAGNOSIS — F320 Major depressive disorder, single episode, mild: Secondary | ICD-10-CM | POA: Diagnosis not present

## 2020-03-15 ENCOUNTER — Ambulatory Visit: Payer: Medicare HMO | Admitting: Psychologist

## 2020-03-26 ENCOUNTER — Other Ambulatory Visit: Payer: Self-pay | Admitting: Physician Assistant

## 2020-03-30 ENCOUNTER — Ambulatory Visit (INDEPENDENT_AMBULATORY_CARE_PROVIDER_SITE_OTHER): Payer: Medicare HMO | Admitting: Psychologist

## 2020-03-30 DIAGNOSIS — F320 Major depressive disorder, single episode, mild: Secondary | ICD-10-CM

## 2020-04-13 ENCOUNTER — Ambulatory Visit (INDEPENDENT_AMBULATORY_CARE_PROVIDER_SITE_OTHER): Payer: Medicare HMO | Admitting: Psychologist

## 2020-04-13 DIAGNOSIS — F320 Major depressive disorder, single episode, mild: Secondary | ICD-10-CM

## 2020-04-25 ENCOUNTER — Ambulatory Visit (INDEPENDENT_AMBULATORY_CARE_PROVIDER_SITE_OTHER): Payer: Medicare HMO | Admitting: Psychologist

## 2020-04-25 DIAGNOSIS — F320 Major depressive disorder, single episode, mild: Secondary | ICD-10-CM | POA: Diagnosis not present

## 2020-05-05 ENCOUNTER — Ambulatory Visit: Payer: Medicare HMO | Admitting: Psychologist

## 2020-05-11 ENCOUNTER — Ambulatory Visit (INDEPENDENT_AMBULATORY_CARE_PROVIDER_SITE_OTHER): Payer: Medicare HMO | Admitting: Psychologist

## 2020-05-11 DIAGNOSIS — F320 Major depressive disorder, single episode, mild: Secondary | ICD-10-CM

## 2020-05-15 ENCOUNTER — Telehealth: Payer: Self-pay | Admitting: *Deleted

## 2020-05-15 MED ORDER — METOPROLOL SUCCINATE ER 25 MG PO TB24
25.0000 mg | ORAL_TABLET | Freq: Every day | ORAL | 0 refills | Status: DC
Start: 2020-05-15 — End: 2020-05-31

## 2020-05-15 NOTE — Telephone Encounter (Signed)
Pt called back told her Rx was sent to [facility] as requested. Pt verbalized understanding.

## 2020-05-15 NOTE — Telephone Encounter (Signed)
Tried to contact pt unable to leave message voicemail full. Rx was sent to [facility] pharmacy as requested.

## 2020-05-15 NOTE — Telephone Encounter (Signed)
Who Is Calling Patient / Member / Family / Caregiver Caller Name [name] Phone Number [phone] Patient Name Sue Little Patient DOB 19-May-1940 Call Type Message Only Information Provided Reason for Call Medication Question / Request Initial Comment Caller is from [facility]. Pt says she has missed placed her Metoprolol and is needing an order for 30 day supply [facility] to [facility] ([phone]) on [address]. Pt sees Jarold Motto. Her mail order is not due to be filled until 05/28/2020. She has enough for 5 days

## 2020-05-16 ENCOUNTER — Other Ambulatory Visit: Payer: Self-pay | Admitting: *Deleted

## 2020-05-16 MED ORDER — BUSPIRONE HCL 15 MG PO TABS: ORAL_TABLET | ORAL | 1 refills | Status: DC

## 2020-05-16 MED ORDER — CITALOPRAM HYDROBROMIDE 40 MG PO TABS: 40.0000 mg | ORAL_TABLET | Freq: Every day | ORAL | 1 refills | Status: DC

## 2020-05-30 ENCOUNTER — Ambulatory Visit: Payer: Medicare HMO | Admitting: Psychologist

## 2020-05-31 ENCOUNTER — Encounter: Payer: Self-pay | Admitting: Physician Assistant

## 2020-05-31 ENCOUNTER — Other Ambulatory Visit: Payer: Self-pay

## 2020-05-31 ENCOUNTER — Ambulatory Visit (INDEPENDENT_AMBULATORY_CARE_PROVIDER_SITE_OTHER): Payer: Medicare HMO | Admitting: Physician Assistant

## 2020-05-31 VITALS — BP 132/80 | HR 66 | Temp 98.0°F | Ht 62.0 in | Wt 163.5 lb

## 2020-05-31 DIAGNOSIS — R252 Cramp and spasm: Secondary | ICD-10-CM

## 2020-05-31 DIAGNOSIS — E785 Hyperlipidemia, unspecified: Secondary | ICD-10-CM

## 2020-05-31 DIAGNOSIS — I10 Essential (primary) hypertension: Secondary | ICD-10-CM | POA: Diagnosis not present

## 2020-05-31 DIAGNOSIS — N644 Mastodynia: Secondary | ICD-10-CM | POA: Diagnosis not present

## 2020-05-31 DIAGNOSIS — Z9119 Patient's noncompliance with other medical treatment and regimen: Secondary | ICD-10-CM

## 2020-05-31 DIAGNOSIS — F419 Anxiety disorder, unspecified: Secondary | ICD-10-CM

## 2020-05-31 DIAGNOSIS — E538 Deficiency of other specified B group vitamins: Secondary | ICD-10-CM

## 2020-05-31 DIAGNOSIS — R0981 Nasal congestion: Secondary | ICD-10-CM

## 2020-05-31 DIAGNOSIS — K5900 Constipation, unspecified: Secondary | ICD-10-CM | POA: Diagnosis not present

## 2020-05-31 DIAGNOSIS — Z720 Tobacco use: Secondary | ICD-10-CM

## 2020-05-31 DIAGNOSIS — M25551 Pain in right hip: Secondary | ICD-10-CM

## 2020-05-31 DIAGNOSIS — Z91199 Patient's noncompliance with other medical treatment and regimen due to unspecified reason: Secondary | ICD-10-CM

## 2020-05-31 LAB — COMPREHENSIVE METABOLIC PANEL
ALT: 15 U/L (ref 0–35)
AST: 14 U/L (ref 0–37)
Albumin: 4.2 g/dL (ref 3.5–5.2)
Alkaline Phosphatase: 65 U/L (ref 39–117)
BUN: 10 mg/dL (ref 6–23)
CO2: 28 mEq/L (ref 19–32)
Calcium: 9.8 mg/dL (ref 8.4–10.5)
Chloride: 104 mEq/L (ref 96–112)
Creatinine, Ser: 0.83 mg/dL (ref 0.40–1.20)
GFR: 67.46 mL/min (ref >60.00)
Glucose, Bld: 106 mg/dL — ABNORMAL HIGH (ref 70–99)
Potassium: 4.1 mEq/L (ref 3.5–5.1)
Sodium: 139 mEq/L (ref 135–145)
Total Bilirubin: 0.5 mg/dL (ref 0.2–1.2)
Total Protein: 6.8 g/dL (ref 6.0–8.3)

## 2020-05-31 LAB — CBC WITH DIFFERENTIAL/PLATELET
Basophils Absolute: 0.1 10*3/uL (ref 0.0–0.1)
Basophils Relative: 0.7 % (ref 0.0–3.0)
Eosinophils Absolute: 0.2 10*3/uL (ref 0.0–0.7)
Eosinophils Relative: 2.2 % (ref 0.0–5.0)
HCT: 41.6 % (ref 36.0–46.0)
Hemoglobin: 14 g/dL (ref 12.0–15.0)
Lymphocytes Relative: 35.8 % (ref 12.0–46.0)
Lymphs Abs: 3.6 10*3/uL (ref 0.7–4.0)
MCHC: 33.7 g/dL (ref 30.0–36.0)
MCV: 84.2 fl (ref 78.0–100.0)
Monocytes Absolute: 0.8 10*3/uL (ref 0.1–1.0)
Monocytes Relative: 7.8 % (ref 3.0–12.0)
Neutro Abs: 5.4 10*3/uL (ref 1.4–7.7)
Neutrophils Relative %: 53.5 % (ref 43.0–77.0)
Platelets: 236 10*3/uL (ref 150.0–400.0)
RBC: 4.94 Mil/uL (ref 3.87–5.11)
RDW: 13.3 % (ref 11.5–15.5)
WBC: 10.1 10*3/uL (ref 4.0–10.5)

## 2020-05-31 LAB — LDL CHOLESTEROL, DIRECT: Direct LDL: 116 mg/dL

## 2020-05-31 LAB — LIPID PANEL
Cholesterol: 201 mg/dL — ABNORMAL HIGH (ref 0–200)
HDL: 36.9 mg/dL — ABNORMAL LOW (ref >39.00)
NonHDL: 164.34
Total CHOL/HDL Ratio: 5
Triglycerides: 297 mg/dL — ABNORMAL HIGH (ref 0.0–149.0)
VLDL: 59.4 mg/dL — ABNORMAL HIGH (ref 0.0–40.0)

## 2020-05-31 LAB — B12 AND FOLATE PANEL
Folate: 11.2 ng/mL (ref >5.9)
Vitamin B-12: 301 pg/mL (ref 211–911)

## 2020-05-31 LAB — IRON: Iron: 87 ug/dL (ref 42–145)

## 2020-05-31 LAB — FERRITIN: Ferritin: 47.5 ng/mL (ref 10.0–291.0)

## 2020-05-31 MED ORDER — LORAZEPAM 0.5 MG PO TABS: ORAL_TABLET | ORAL | 1 refills | Status: DC

## 2020-05-31 MED ORDER — NYSTATIN 100000 UNIT/GM EX OINT: TOPICAL_OINTMENT | CUTANEOUS | 0 refills | Status: AC

## 2020-05-31 MED ORDER — METOPROLOL SUCCINATE ER 25 MG PO TB24: 25.0000 mg | ORAL_TABLET | Freq: Every day | ORAL | 2 refills | Status: DC

## 2020-05-31 MED ORDER — CITALOPRAM HYDROBROMIDE 40 MG PO TABS: 40.0000 mg | ORAL_TABLET | Freq: Every day | ORAL | 1 refills | Status: DC

## 2020-05-31 MED ORDER — BUSPIRONE HCL 15 MG PO TABS: ORAL_TABLET | ORAL | 1 refills | Status: DC

## 2020-05-31 NOTE — Progress Notes (Signed)
Sue Little is a 80 y.o. female is here for follow up.  I acted as a Neurosurgeon for [employer], PA-C Corky Mull, LPN   History of Present Illness:   Chief Complaint  Patient presents with  . Hypertension    HPI  Hypertension Pt here for f/u, currently taking Metoprolol 25 mg daily. She is not checking her blood pressure. Pt denies headaches, dizziness, blurred vision, chest pain, SOB or lower leg edema. Denies excessive caffeine intake, stimulant usage, excessive alcohol intake or increase in salt consumption.  BP Readings from Last 3 Encounters:  05/31/20 132/80  02/15/20 140/78  12/07/19 120/62    Anxiety/Depression Currently taking Buspar 15 mg BID, Celexa 40 mg daily, and Ativan 1/4 to 1 tablet daily prn. Pt says she is feeling good about her life right now.  She overall feels good about her mood. Denies SI/HI.   R hip pain Has overall constant pain. Has been going on for about a month. Has not fallen because of it. Has not taken anything for pain. Cannot start walking on her R foot when she gets up to walk because pain is so bothersome. Denies known trauma, numbness, tingling, weakness.    R arm pain Has had decreased ROM of R arm since she got vaccine in Jan at [facility]. Slowly improving with time but she is concerned that it caused so much pain. Denies weakness, numbness, tingling. Pain in bicep/tricep area mostly.   Constipation Chronic issue for her. She has had very large and sometimes painful BMs. Has hemorrhoids that she states occasionally bleeds. We had recommend miralax in the past but said she could not get at pharmacy so she has tried mineral oil instead, does not like how it makes her feel. Has poor diet overall and admits to high fat foods. Has not had and does not want colonoscopy.   L breast pain Over the past month has had L lateral breast pain. Pain is reproducible. She is not UTD on mammograms. Denies palpable nodule, fam hx of breast  cancer, or nipple changes.    Yeast infection Has intense itching on vulva. Wears panty liner daily. Changes regularly. Has tried vagisil wipes without relief. Denies vaginal discharge or bleeding.   Nasal congestion Has ongoing sinus congestion. We prescribed atrovent nasal spray at last visit but she has not used because it says on the label to use continuously and she doesn't want to use daily. Denies: fever, chills, loss of taste/smell.   HLD She has been recommended to do a statin in the past but she has refused this and zetia. Due for lipid screening today.   B12 deficiency She has been taking oral B12. Due for recheck. Hasn't noticed significant difference with taking this.   Bilateral leg cramps Has been going on for years. Can sometimes cause her to not be able to walk when they are very sharp/severe. Denies swelling to legs. Has not tried anything. Wondering if she can start potassium.  Health Maintenance Due  Topic Date Due  . Hepatitis C Screening  Never done  . DEXA SCAN  Never done    Past Medical History:  Diagnosis Date  . Anxiety disorder   . Depression   . History of chicken pox   . Hyperlipidemia   . Hypertension   . Tobacco abuse    started at age 6 years     Social History   Tobacco Use  . Smoking status: Current Every Day Smoker  Packs/day: 1.00    Types: Cigarettes    Start date: 03/11/1960  . Smokeless tobacco: Never Used  Vaping Use  . Vaping Use: Never used  Substance Use Topics  . Alcohol use: Never    Past Surgical History:  Procedure Laterality Date  . ABDOMINAL HYSTERECTOMY  1978  . CARDIAC CATHETERIZATION    . esohagus stretched    . TONSILLECTOMY      Family History  Problem Relation Age of Onset  . Diabetes Mother   . Heart disease Mother   . Coronary artery disease Mother   . Dementia Mother   . Alcohol abuse Father   . Heart attack Father   . Depression Sister   . Lung cancer Maternal Grandfather   .  Osteoarthritis Sister   . Depression Sister   . Prostate cancer Brother   . Prostate cancer Brother     PMHx, SurgHx, SocialHx, FamHx, Medications, and Allergies were reviewed in the Visit Navigator and updated as appropriate.   Patient Active Problem List   Diagnosis Date Noted  . PVC (premature ventricular contraction) 07/13/2019  . Hyperlipidemia 07/13/2019  . Tobacco abuse 07/05/2019  . Essential hypertension 07/05/2019  . Anxiety 07/05/2019  . Neck pain 07/05/2019  . Depression, major, single episode, mild (HCC) 07/05/2019    Social History   Tobacco Use  . Smoking status: Current Every Day Smoker    Packs/day: 1.00    Types: Cigarettes    Start date: 03/11/1960  . Smokeless tobacco: Never Used  Vaping Use  . Vaping Use: Never used  Substance Use Topics  . Alcohol use: Never    Current Medications and Allergies:    Current Outpatient Medications:  .  aspirin EC 81 MG tablet, Take 81 mg by mouth daily. Swallow whole., Disp: , Rfl:  .  nystatin ointment (MYCOSTATIN), Apply to affected area daily, Disp: 30 g, Rfl: 0 .  busPIRone (BUSPAR) 15 MG tablet, Take 1 tablet by mouth BID, Disp: 180 tablet, Rfl: 1 .  citalopram (CELEXA) 40 MG tablet, Take 1 tablet (40 mg total) by mouth daily., Disp: 90 tablet, Rfl: 1 .  ipratropium (ATROVENT) 0.03 % nasal spray, Place 2 sprays into both nostrils every 12 (twelve) hours. (Patient not taking: Reported on 05/31/2020), Disp: 30 mL, Rfl: 12 .  LORazepam (ATIVAN) 0.5 MG tablet, Pt to take 1/4 tablet to 1 tablet daily as needed., Disp: 90 tablet, Rfl: 1 .  metoprolol succinate (TOPROL-XL) 25 MG 24 hr tablet, Take 1 tablet (25 mg total) by mouth daily., Disp: 90 tablet, Rfl: 2 .  mineral oil liquid, Take by mouth at bedtime. Pt takes one tablespoon (Patient not taking: Reported on 05/31/2020), Disp: , Rfl:  .  Omega-3 Fatty Acids (FISH OIL PO), Take 5 mLs by mouth daily. (Patient not taking: Reported on 05/31/2020), Disp: , Rfl:     Allergies  Allergen Reactions  . Procaine Other (See Comments) and Hives    Hypotension, faint Hypotension, faint  . Ivp Dye [Iodinated Diagnostic Agents] Hives    swelling  . Sulfa Antibiotics Hives    Review of Systems   ROS  Vitals:   Vitals:   05/31/20 1315  BP: 132/80  Pulse: 66  Temp: 98 F (36.7 C)  TempSrc: Temporal  SpO2: 96%  Weight: 163 lb 8 oz (74.2 kg)  Height: 5\' 2"  (1.575 m)     Body mass index is 29.9 kg/m.   Physical Exam:    Physical Exam Vitals and nursing  note reviewed.  Constitutional:      General: She is not in acute distress.    Appearance: She is well-developed. She is not ill-appearing or toxic-appearing.  Cardiovascular:     Rate and Rhythm: Normal rate and regular rhythm.     Pulses: Normal pulses.     Heart sounds: Normal heart sounds, S1 normal and S2 normal.     Comments: No LE edema Pulmonary:     Effort: Pulmonary effort is normal.     Breath sounds: Normal breath sounds.  Chest:     Comments: L lateral breast with TTP Musculoskeletal:     Comments: R hip: pain with flexion of R leg, normal gait  R arm: slight TTP of tricep area; decreased overhead ROM  Skin:    General: Skin is warm and dry.  Neurological:     Mental Status: She is alert.     GCS: GCS eye subscore is 4. GCS verbal subscore is 5. GCS motor subscore is 6.     Comments: Grip strength 5/5  Psychiatric:        Speech: Speech normal.        Behavior: Behavior normal. Behavior is cooperative.      Assessment and Plan:    Steward DroneBrenda was seen today for hypertension.  Diagnoses and all orders for this visit:  Essential hypertension Well controlled. Continue metoprolol 25 mg daily. Follow-up in 6 months. -     CBC with Differential/Platelet -     Comprehensive metabolic panel  Breast pain, left Stat mammogram order placed. She states that she will proceed with this. -     MM Digital Diagnostic Unilat L; Future  Constipation, unspecified  constipation type Recommend colonoscopy, she declines. Handout on constipation provided. Recommend miralax daily. Referral to GI -- refused by patient.  Nasal congestion Recommend atrovent nasal spray prn. Follow-up if this is ineffective.  Tobacco abuse Counseled. Not ready to quit at this time.  Vaginal itching Trial topical nystatin. Follow-up if worsening or lack of improvement.  Hyperlipidemia, unspecified hyperlipidemia type Update lipid panel and provide recommendations. -     Lipid panel  Anxiety Stable. Continue Buspar 15 mg BID, Celexa 40 mg daily, and Ativan 1/4 to 1 tablet daily. Follow-up in 6 months, sooner if concerns.  Bilateral leg cramps Update blood work including iron levels. Will provide recommendations accordingly. Will also ask sports med to weigh in on this. -     Ferritin -     Iron -     B12 and Folate Panel -     Ambulatory referral to Sports Medicine  B12 deficiency Will update levels today and provide recommendations accordingly. -     B12 and Folate Panel  Right hip pain Referral to sports medicine for further evaluation. She declines medication at this time.  Noncompliance Continues to refuse almost all cancer screening recommendations. She understands the risk of this. Will continue to encourage compliance at each visit.  Other orders -     nystatin ointment (MYCOSTATIN); Apply to affected area daily -     metoprolol succinate (TOPROL-XL) 25 MG 24 hr tablet; Take 1 tablet (25 mg total) by mouth daily. -     LORazepam (ATIVAN) 0.5 MG tablet; Pt to take 1/4 tablet to 1 tablet daily as needed. -     citalopram (CELEXA) 40 MG tablet; Take 1 tablet (40 mg total) by mouth daily. -     busPIRone (BUSPAR) 15 MG tablet; Take 1 tablet by  mouth BID   CMA or LPN served as scribe during this visit. History, Physical, and Plan performed by medical provider. The above documentation has been reviewed and is accurate and complete.  Time spent  with patient today was 60 minutes which consisted of chart review, discussing diagnosis, work up, treatment answering questions and documentation.  Jarold Motto, PA-C [place] 05/31/2020  Follow-up: No follow-ups on file.

## 2020-05-31 NOTE — Patient Instructions (Signed)
It was great to see you!  I will refill all scripts for 6 months.  For your itchy rash Use nystatin ointment and change panty liners often  For your breast pain We are scheduling mammogram. This is important please go.  For your palpitations Please work on tobacco reduction We will do cardiac work-up when you are ready, but if you have new chest pain or shortness DO NOT DELAY CARE  For your nasal congestion You may use nasal spray as needed -- you do not need to take daily.  For your bowels Take over the counter miralax -- 1 capful daily. Work on better diet -- see handout Let me know when you are ready to see GI for colonoscopy  For your hip and leg pain A referral has been placed for you to see Dr. [name] with [facility]. Someone from there office will be in touch soon regarding your appointment with him. His location: Psychiatrist Medicine at [facility] [address] on the 1st floor.   Phone number [phone], Fax [phone].  This location is across the street from the entrance to [facility] and in the same complex as the [facility] and [facility]  For your arm pain -Voltaren Gel is available over the counter.  -You are to apply this gel to your injured body part twice daily (morning and evening).   A little goes a long way so you can use about a pea-sized amount for each area.  ? Spread this small amount over the area into a thin film and let it dry.  ? Be sure that you do not rub the gel into your skin for more than 10 or 15 seconds otherwise it can irritate you skin.   ? Once you apply the gel, please do not put any other lotion or clothing in contact with that area for 30 minutes to allow the gel to absorb into your skin.   Some people are sensitive to the medication and can develop a sunburn-like rash.  If you have only mild symptoms it is okay to continue to use the medication but if you have any breakdown of  your skin you should discontinue its use and please let us know.      Let's follow-up in 6 monhts, sooner if you have concerns.  If a referral was placed today, you will be contacted for an appointment. Please note that routine referrals can sometimes take up to 3-4 weeks to process. Please call our office if you haven't heard anything after this time frame.  Take care,  Jarold Motto PA-C

## 2020-06-01 ENCOUNTER — Telehealth: Payer: Self-pay

## 2020-06-01 ENCOUNTER — Other Ambulatory Visit: Payer: Self-pay | Admitting: Physician Assistant

## 2020-06-01 DIAGNOSIS — N644 Mastodynia: Secondary | ICD-10-CM

## 2020-06-01 NOTE — Telephone Encounter (Signed)
Pt states she can't get a mammogram at the place [facility] sent her to without having her previous mammogram images. Pt does not know where she got her previous mammogram or how to get those images.

## 2020-06-01 NOTE — Telephone Encounter (Signed)
Sue Little, can you try to schedule pt somewhere else for Mammogram that will see her with no films due to problem.

## 2020-06-01 NOTE — Telephone Encounter (Signed)
Pt is scheduled 06/15/20 at the [facility] and I called and left her a VM with instructions and she is also on a cancellation list-I will keep trying her number so that I can make sure she understands the instructions

## 2020-06-05 NOTE — Progress Notes (Unsigned)
I, Sue Little, LAT, ATC acting as a scribe for Sue Graham, MD.  Subjective:    I'm seeing this patient as a consultation for Sue Little, [place]. Note will be routed back to referring provider/PCP.  CC: Bilateral leg cramping and R hip pain   HPI: Pt is a 80 y/o female c/o R hip pain ongoing for about a month. MOI: No falls or mechanism. Pt locates pain to lateral aspect of R hip that radiates to anterior thigh. Of note, pt is a smoker.  Low back pain: sometimes Radiates: yes LE Numbness/tingling: no- excepct feet LE Weakness: no Aggravates: walking Treatments tried:  Pt also c/o bilat leg cramping that's been going on for year. Pt reports sometimes pain causes her to not be able to walk when pain is sharp/severe. Pt reports experiencing bilat feet and then pain radiates up into calves.  She notes this occurs when she walks and gets better immediately when she rests.  Pt also c/o R shoulder pain. Pt had her COVID booster 04/13/20 . Increased pain w/ IR, especially when trying to put on her bra. Pt locates pain the lateral aspect of upper arm. Pt is R-hand dominate. Of note, pt had a steroid injection 20+ years ago by brother-in-law and she broke out in a rash.   Past medical history, Surgical history, Family history, Social history, Allergies, and medications have been entered into the medical record, reviewed.  Smoker.  Allergic to IV contrast.  Possible allergy to steroids. Patient does not drive.  Review of Systems: No new headache, visual changes, nausea, vomiting, diarrhea, constipation, dizziness, abdominal pain, skin rash, fevers, chills, night sweats, weight loss, swollen lymph nodes, body aches, joint swelling, muscle aches, chest pain, shortness of breath, mood changes, visual or auditory hallucinations.   Objective:    Vitals:   06/06/20 1300  BP: 140/88  Pulse: 64  SpO2: 98%   General: Well Developed, well nourished, and in no acute distress.  Neuro/Psych: Alert  and oriented x3, extra-ocular muscles intact, able to move all 4 extremities, sensation grossly intact. Skin: Warm and dry, no rashes noted.  Respiratory: Not using accessory muscles, speaking in full sentences, trachea midline.  Cardiovascular: Pulses palpable, no extremity edema. Abdomen: Does not appear distended. MSK:  Right shoulder normal-appearing Nontender. Range of motion abduction limited to 120 degrees.  External rotation full internal rotation lumbar spine. Strength 4/5 abduction and external rotation 5/5 internal rotation. Positive Hawkins and Neer's test positive empty can test. Negative Yergason's and speeds test.  Right hip normal-appearing Normal motion. Tender palpation greater trochanter. Hip abduction and external rotation strength reduced 4/5.  Calves and ankles normal-appearing Nontender. Decreased pulses bilateral dorsal pedis and posterior tibialis  Lab and Radiology Results  X-ray images right shoulder right hip and L-spine obtained today personally and independently interpreted.  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  No acute fractures.  Right hip: Mild right DJD.  No fractures.  L-spine: Mild lower DDD  Await formal radiology review  Impression and Recommendations:    Assessment and Plan: 80 y.o. female with multifactorial pain.  Right shoulder pain thought to be related to rotator cuff tendinopathy.  Plan for home health physical therapy.  Recheck in 4 to 6 weeks.  If not improved consider outpatient PT if possible.  Ideally would consider steroid injection however if she has a somewhat possible allergic reaction to a steroid injection in the past.  I do not think this is a deal breaker  for injections in the future however.  Right hip pain thought to be related to hip abductor tendinopathy/trochanteric bursitis.  Again PT and potential injection in the future.  Leg pain: Bilateral lower leg pain.  Concerning for claudication.   Patient is a smoker and at high risk for claudication.  I had had a difficult time palpating pulses.  Plan for ABI to further evaluate for claudication.  Alternate diagnosis includes neurogenic claudication.  Plan for ABI recheck in 6 weeks.Marland Kitchen  PDMP not reviewed this encounter. Orders Placed This Encounter  Procedures  . DG Shoulder Right    Standing Status:   Future    Number of Occurrences:   1    Standing Expiration Date:   06/06/2021    Order Specific Question:   Reason for Exam (SYMPTOM  OR DIAGNOSIS REQUIRED)    Answer:   chronic right shoulder pain    Order Specific Question:   Preferred imaging location?    Answer:   [facility]  . DG HIP UNILAT W OR W/O PELVIS 2-3 VIEWS RIGHT    Standing Status:   Future    Number of Occurrences:   1    Standing Expiration Date:   06/06/2021    Order Specific Question:   Reason for Exam (SYMPTOM  OR DIAGNOSIS REQUIRED)    Answer:   chronic right hip pain    Order Specific Question:   Preferred imaging location?    Answer:   [facility]  . DG Lumbar Spine 2-3 Views    Standing Status:   Future    Number of Occurrences:   1    Standing Expiration Date:   06/06/2021    Order Specific Question:   Reason for Exam (SYMPTOM  OR DIAGNOSIS REQUIRED)    Answer:   eval possible neurogenic claudication    Order Specific Question:   Preferred imaging location?    Answer:   [facility]  . Ambulatory referral to Home Health    Referral Priority:   Routine    Referral Type:   Home Health Care    Referral Reason:   Specialty Services Required    Requested Specialty:   Home Health Services    Number of Visits Requested:   1   No orders of the defined types were placed in this encounter.   Discussed warning signs or symptoms. Please see discharge instructions. Patient expresses understanding.   The above documentation has been reviewed and is accurate and complete Sue Little, M.D.

## 2020-06-06 ENCOUNTER — Other Ambulatory Visit: Payer: Self-pay

## 2020-06-06 ENCOUNTER — Ambulatory Visit (INDEPENDENT_AMBULATORY_CARE_PROVIDER_SITE_OTHER): Payer: Medicare HMO

## 2020-06-06 ENCOUNTER — Ambulatory Visit (INDEPENDENT_AMBULATORY_CARE_PROVIDER_SITE_OTHER): Payer: Medicare HMO | Admitting: Family Medicine

## 2020-06-06 VITALS — BP 140/88 | HR 64 | Ht 62.0 in | Wt 164.6 lb

## 2020-06-06 DIAGNOSIS — G8929 Other chronic pain: Secondary | ICD-10-CM

## 2020-06-06 DIAGNOSIS — M25511 Pain in right shoulder: Secondary | ICD-10-CM

## 2020-06-06 DIAGNOSIS — I739 Peripheral vascular disease, unspecified: Secondary | ICD-10-CM

## 2020-06-06 DIAGNOSIS — M25551 Pain in right hip: Secondary | ICD-10-CM

## 2020-06-06 NOTE — Patient Instructions (Addendum)
Thank you for coming in today.  Please get an Xray today before you leave.  I've referred you to home health Physical Therapy.  Let us know if you don't hear from them in one week.  You should heat about the ABI blood test soon. Let me know if you do not hear about that.   Recheck in about 1 month.

## 2020-06-07 NOTE — Progress Notes (Signed)
X-ray right hip looks normal to radiology.

## 2020-06-07 NOTE — Progress Notes (Signed)
X-ray right shoulder shows some arthritis changes of the small joint at the top of the shoulder (AC joint) but not arthritis of the main shoulder joint.

## 2020-06-07 NOTE — Progress Notes (Signed)
Xray lumbar spine shows mild arthritis.

## 2020-06-12 ENCOUNTER — Ambulatory Visit (HOSPITAL_COMMUNITY)
Admission: RE | Admit: 2020-06-12 | Discharge: 2020-06-12 | Disposition: A | Discharge status: Home / Self Care | Payer: Medicare HMO | Source: Ambulatory Visit | Attending: Cardiology | Admitting: Cardiology

## 2020-06-12 ENCOUNTER — Other Ambulatory Visit: Payer: Self-pay

## 2020-06-12 DIAGNOSIS — I739 Peripheral vascular disease, unspecified: Secondary | ICD-10-CM | POA: Insufficient documentation

## 2020-06-13 ENCOUNTER — Telehealth: Payer: Self-pay | Admitting: Family Medicine

## 2020-06-13 DIAGNOSIS — I739 Peripheral vascular disease, unspecified: Secondary | ICD-10-CM

## 2020-06-13 NOTE — Telephone Encounter (Signed)
Referral to vascular surgery  

## 2020-06-13 NOTE — Progress Notes (Signed)
Blood flow test shows that you have some impaired blood flow in the right leg in the medium range and mild in the left leg.  This requires further evaluation but probably does explain some of the symptoms that you are having.  I am referring you to vascular surgery who typically will further evaluate this and treat it if needed.  I still recommend that you follow-up with me as scheduled in May.

## 2020-06-13 NOTE — Telephone Encounter (Signed)
Pt contacted regarding her vascular doppler US results and advised that referral has been placed to vascular surgery.

## 2020-06-15 ENCOUNTER — Ambulatory Visit
Admission: RE | Admit: 2020-06-15 | Discharge: 2020-06-15 | Disposition: A | Discharge status: Home / Self Care | Payer: Medicare HMO | Source: Ambulatory Visit | Attending: Physician Assistant | Admitting: Physician Assistant

## 2020-06-15 ENCOUNTER — Other Ambulatory Visit: Payer: Self-pay | Admitting: Physician Assistant

## 2020-06-15 ENCOUNTER — Other Ambulatory Visit: Payer: Self-pay

## 2020-06-15 DIAGNOSIS — N644 Mastodynia: Secondary | ICD-10-CM

## 2020-06-15 DIAGNOSIS — N6310 Unspecified lump in the right breast, unspecified quadrant: Secondary | ICD-10-CM

## 2020-06-16 ENCOUNTER — Ambulatory Visit (INDEPENDENT_AMBULATORY_CARE_PROVIDER_SITE_OTHER): Payer: Medicare HMO | Admitting: Psychologist

## 2020-06-16 DIAGNOSIS — F320 Major depressive disorder, single episode, mild: Secondary | ICD-10-CM | POA: Diagnosis not present

## 2020-06-27 ENCOUNTER — Ambulatory Visit (INDEPENDENT_AMBULATORY_CARE_PROVIDER_SITE_OTHER): Payer: Medicare HMO | Admitting: Psychologist

## 2020-06-27 DIAGNOSIS — F320 Major depressive disorder, single episode, mild: Secondary | ICD-10-CM | POA: Diagnosis not present

## 2020-06-29 ENCOUNTER — Encounter: Payer: Medicare HMO | Admitting: Vascular Surgery

## 2020-07-11 ENCOUNTER — Ambulatory Visit (INDEPENDENT_AMBULATORY_CARE_PROVIDER_SITE_OTHER): Payer: Medicare HMO | Admitting: Psychologist

## 2020-07-11 DIAGNOSIS — F320 Major depressive disorder, single episode, mild: Secondary | ICD-10-CM | POA: Diagnosis not present

## 2020-07-13 ENCOUNTER — Encounter: Payer: Self-pay | Admitting: Vascular Surgery

## 2020-07-13 ENCOUNTER — Ambulatory Visit: Payer: Medicare HMO | Admitting: Vascular Surgery

## 2020-07-13 ENCOUNTER — Other Ambulatory Visit: Payer: Self-pay

## 2020-07-13 VITALS — BP 146/75 | HR 60 | Temp 97.9°F | Resp 20 | Ht 62.0 in | Wt 164.9 lb

## 2020-07-13 DIAGNOSIS — Z716 Tobacco abuse counseling: Secondary | ICD-10-CM

## 2020-07-13 DIAGNOSIS — I739 Peripheral vascular disease, unspecified: Secondary | ICD-10-CM | POA: Diagnosis not present

## 2020-07-13 NOTE — Progress Notes (Signed)
Referring Physician: Dr. [name]  Patient name: Sue Little MRN: [id number] DOB: Jun 26, 1940 Sex: female  REASON FOR CONSULT: Claudication  HPI: Sue Little is a 80 y.o. female, with a several year history of a cramping sensation that starts in her feet and works up into her calf in both legs.  This pain occurs fairly inconsistently.  It happens on some days but not on others.  She does not really have a consistent walking distance.  She does not have a history of rest pain or nonhealing wounds.  Sometimes she can walk all day and just becomes fatigued and not really have any leg symptoms.  She currently smokes just under a pack of cigarettes per day.  Greater than 10 minutes today spent regarding smoking cessation counseling.  Other medical problems include hyperlipidemia and hypertension.  Discussions apparently have been held with her in the past about starting a statin but she was resistant to this.  She does take an aspirin daily.  Past Medical History:  Diagnosis Date  . Allergy   . Anxiety disorder   . Depression   . History of chicken pox   . Hyperlipidemia   . Hypertension   . Tobacco abuse    started at age 3 years   Past Surgical History:  Procedure Laterality Date  . ABDOMINAL HYSTERECTOMY  1978  . CARDIAC CATHETERIZATION    . esohagus stretched    . TONSILLECTOMY      Family History  Problem Relation Age of Onset  . Diabetes Mother   . Heart disease Mother   . Coronary artery disease Mother   . Dementia Mother   . Alcohol abuse Father   . Heart attack Father   . Depression Sister   . Lung cancer Maternal Grandfather   . Osteoarthritis Sister   . Depression Sister   . Prostate cancer Brother   . Prostate cancer Brother     SOCIAL HISTORY: Social History   Socioeconomic History  . Marital status: Widowed    Spouse name: Not on file  . Number of children: Not on file  . Years of education: Not on file  . Highest education level: Not on file   Occupational History  . Not on file  Tobacco Use  . Smoking status: Current Every Day Smoker    Packs/day: 1.00    Types: Cigarettes    Start date: 03/11/1960  . Smokeless tobacco: Never Used  Vaping Use  . Vaping Use: Never used  Substance and Sexual Activity  . Alcohol use: Never  . Drug use: Never  . Sexual activity: Not on file  Other Topics Concern  . 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 Procaine Other (See Comments) and Hives    Hypotension, faint Hypotension, faint  . Ivp Dye [Iodinated Diagnostic Agents] Hives    swelling  . Sulfa Antibiotics Hives    Current Outpatient Medications  Medication Sig Dispense Refill  . aspirin EC 81 MG tablet Take 81 mg by mouth daily. Swallow whole.    . busPIRone (BUSPAR) 15 MG tablet Take 1 tablet by mouth BID 180 tablet 1  . citalopram (CELEXA) 40 MG tablet Take 1 tablet (40 mg total) by mouth daily. 90 tablet 1  . LORazepam (ATIVAN) 0.5 MG tablet Pt to take 1/4 tablet to 1 tablet daily as needed. 90 tablet 1  . metoprolol succinate (TOPROL-XL) 25 MG 24 hr tablet Take 1 tablet (25 mg total) by mouth daily. 90 tablet 2  . nystatin ointment (MYCOSTATIN) Apply to affected area daily 30 g 0  . ipratropium (ATROVENT) 0.03 % nasal spray Place 2 sprays into both nostrils every 12 (twelve) hours. (Patient not taking: No sig reported) 30 mL 12  . mineral oil liquid Take by mouth at bedtime. Pt takes one tablespoon (Patient not taking: No sig  reported)    . Omega-3 Fatty Acids (FISH OIL PO) Take 5 mLs by mouth daily. (Patient not taking: No sig reported)     No current facility-administered medications for this visit.    ROS:   General:  No weight loss, Fever, chills  HEENT: No recent headaches, no nasal bleeding, no visual changes, no sore throat  Neurologic: No dizziness, blackouts, seizures. No recent symptoms of stroke or mini- stroke. No recent episodes of slurred speech, or temporary blindness.  Cardiac: No recent episodes of chest pain/pressure, no shortness of breath at rest.  No shortness of breath with exertion.  Denies history of atrial fibrillation or irregular heartbeat  Vascular: No history of rest pain in feet.  No history of claudication.  No history of non-healing ulcer, No history of DVT   Pulmonary: No home oxygen, no productive cough, no hemoptysis,  No asthma or wheezing  Musculoskeletal:  [ ]  Arthritis, [ ]  Low back pain,  [ ]  Joint pain  Hematologic:No history of hypercoagulable state.  No history of easy bleeding.  No history of anemia  Gastrointestinal: No hematochezia or melena,  No gastroesophageal reflux, no trouble swallowing  Urinary: [ ]  chronic Kidney disease, [ ]  on HD - [ ]  MWF or [ ]  TTHS, [ ]  Burning with urination, [ ]  Frequent urination, [ ]  Difficulty urinating;   Skin: No rashes  Psychological: No history of anxiety,  No history of depression   Physical Examination  Vitals:   07/13/20 1017 07/13/20 1019  BP: (!) 146/73 (!) 146/75  Pulse: 60   Resp: 20   Temp: 97.9 F (36.6 C)   SpO2: 98%   Weight: 164 lb 14.4 oz (74.8 kg)   Height: 5\' 2"  (1.575 m)     Body mass index is 30.16 kg/m.  General:  Alert and oriented, no acute distress HEENT: Normal Neck: No JVD Cardiac: Regular Rate and Rhythm Abdomen: Soft, non-tender, non-distended, no mass Skin: No rash Extremity Pulses:  2+ radial, brachial, 1-2+ femoral, absent popliteal dorsalis pedis, posterior tibial pulses  bilaterally Musculoskeletal: No deformity or edema  Neurologic: Upper and lower extremity motor 5/5 and symmetric  DATA:  Patient had bilateral ABIs performed on June 12, 2020.  Right side was 0.7 left side was 0.9  ASSESSMENT: Bilateral peripheral arterial disease.  Difficult to know whether her symptoms are claudication related or other musculoskeletal issues.  However she certainly has objective evidence of mild to moderate peripheral arterial disease in both legs.  We discussed today the possibility of a walking program of 30 minutes  daily as well as smoking cessation.  I also discussed with her today that her risk of limb loss lifetime is less than 5% if she is able to quit smoking.  She currently is not at risk of limb loss with her current amount of occlusive disease.  She does not really seem debilitated by her leg symptoms currently so I would not consider an intervention unless her symptoms become much worse.   PLAN: Patient will try to quit smoking.  She will start a walking program of 30 minutes daily.  She will follow-up in our APP clinic in 6 months time with bilateral ABIs.  She is going to have further discussions with her primary care physician whether or not she should be placed on a statin.  I did discuss with her today that has been shown in multiple studies that patients with peripheral arterial disease that are on a statin and aspirin tend to live longer.   Fabienne Bruns, MD [facility] Office: [phone]

## 2020-07-14 ENCOUNTER — Telehealth: Payer: Self-pay | Admitting: Family Medicine

## 2020-07-14 ENCOUNTER — Other Ambulatory Visit: Payer: Self-pay

## 2020-07-14 DIAGNOSIS — I739 Peripheral vascular disease, unspecified: Secondary | ICD-10-CM

## 2020-07-14 NOTE — Telephone Encounter (Signed)
Error

## 2020-07-17 NOTE — Progress Notes (Signed)
I, Sue Little, LAT, ATC acting as a scribe for Sue Graham, MD.  Sue Little is a 80 y.o. female who presents to [facility] at [facility] today for f/u chronic R hip pain and bilat leg cramping. Pt was last seen by Dr. Denyse Little on 06/06/20 and was referred to home health PT and referred for an ABI. ABI revealed impaired blood flow and pt [facility]. Pt was seen by Dr. Fabienne Little on 07/13/20 and was advised to quit smoking and start walking 30 minutes a day. Today, pt reports she has been thinking about Dr. Darrick Little' recommendation. Pt is very concerned about the recommendation and feels unable to quit smoking, but is concerned about losing her legs. Pt was able to walk 10 minutes this morning, but was unable to walk another step due to cramping.   Also I referred her to home health physical therapy at the last visit.  However home with physical therapy declined to take the referral.  Apparently she is doing too well for typical home of PT.   Dx testing: 06/06/20 L-spine, R hip, & R shoulder XR  Pertinent review of systems: No fevers or chills  Relevant historical information: Depression history History of a rash following a cortisone shot 20 years ago.  Actual allergic reaction cause somewhat unclear.   Exam:  BP (!) 146/70 (BP Location: Right Arm, Patient Position: Sitting, Cuff Size: Normal)   Pulse (!) 59   Ht 5\' 2"  (1.575 m)   Wt 164 lb 6.4 oz (74.6 kg)   SpO2 98%   BMI 30.07 kg/m  General: Well Developed, well nourished, and in no acute distress.   MSK: Right shoulder normal-appearing normal motion pain with abduction.  Positive Hawkins and Neer's test.  L-spine normal motion mild antalgic gait.    Lab and Radiology Results  EXAM: LUMBAR SPINE - 3 VIEW  COMPARISON:  None.  FINDINGS: Five lumbar type vertebral bodies are well visualized. Vertebral body height is well maintained. No anterolisthesis is noted.  Mild disc space narrowing is noted at L4-5 and L3-4. Facet hypertrophic changes are seen. No soft tissue abnormality is noted.  IMPRESSION: Mild degenerative change without acute abnormality.   Electronically Signed   By: M.D.   On: 06/07/2020 03:18  EXAM: RIGHT SHOULDER - 2+ VIEW  COMPARISON:  None.  FINDINGS: Degenerative changes of the acromioclavicular joint are noted. No acute fracture or dislocation is seen. Underlying bony thorax appears within normal limits.  IMPRESSION: Degenerative change without acute abnormality.   Electronically Signed   By: 06/09/2020 M.D.   On: 06/07/2020 03:12  I, 06/09/2020, personally (independently) visualized and performed the interpretation of the images attached in this note.    Assessment and Plan: 80 y.o. female with right shoulder pain.  Thought to be rotator cuff tendinopathy.  Patient should do quite well with physical therapy.  Try to arrange for home health PT at the last visit however home with PT declined to provide services..  We will switch to outpatient PT although this is much less convenient for the patient she probably will get better services with outpatient PT.  Additionally she is not a great candidate for steroid injection.  She had a bad allergic reaction causing a rash with a cortisone shot 20 years ago.  It is unclear how much this really is a true allergy regardless I would like to avoid steroid injections if possible.  Leg  pain: Patient found to have true claudication with ABI and follow-up vascular studies.  However she is not a surgical candidate at this time as she is not symptomatic enough.  She is being treated conservatively with smoking cessation and exercises.  I think this is a great idea and spent some time talking about this.  However the alternative diagnosis for leg pain is neurogenic claudication which remains a distinct co-diagnosis possibility.  Plan for physical therapy for  the possibility of lumbar radiculopathy and also to work on gait.  Work on smoking cessation.  Discussed cutting back to 5 cigarettes a day.  Will help on tobacco cessation in the future.  Recheck in 1 month.  PDMP not reviewed this encounter. Orders Placed This Encounter  Procedures  . Ambulatory referral to Physical Therapy    Referral Priority:   Routine    Referral Type:   Physical Medicine    Referral Reason:   Specialty Services Required    Requested Specialty:   Physical Therapy   No orders of the defined types were placed in this encounter.    Discussed warning signs or symptoms. Please see discharge instructions. Patient expresses understanding.   The above documentation has been reviewed and is accurate and complete Sue Little, M.D.  Total encounter time 30 minutes including face-to-face time with the patient and, reviewing past medical record, and charting on the date of service.   Reviewed findings discussed treatment plan and options

## 2020-07-18 ENCOUNTER — Ambulatory Visit (INDEPENDENT_AMBULATORY_CARE_PROVIDER_SITE_OTHER): Payer: Medicare HMO | Admitting: Family Medicine

## 2020-07-18 ENCOUNTER — Other Ambulatory Visit: Payer: Self-pay

## 2020-07-18 VITALS — BP 146/70 | HR 59 | Ht 62.0 in | Wt 164.4 lb

## 2020-07-18 DIAGNOSIS — I739 Peripheral vascular disease, unspecified: Secondary | ICD-10-CM | POA: Diagnosis not present

## 2020-07-18 DIAGNOSIS — M25561 Pain in right knee: Secondary | ICD-10-CM

## 2020-07-18 DIAGNOSIS — M25562 Pain in left knee: Secondary | ICD-10-CM

## 2020-07-18 DIAGNOSIS — Z720 Tobacco use: Secondary | ICD-10-CM | POA: Diagnosis not present

## 2020-07-18 DIAGNOSIS — M25511 Pain in right shoulder: Secondary | ICD-10-CM

## 2020-07-18 DIAGNOSIS — G8929 Other chronic pain: Secondary | ICD-10-CM

## 2020-07-18 NOTE — Patient Instructions (Addendum)
Thank you for coming in today.  Plan for PT.   Recheck with me in 6 weeks.   Work on cutting back smoking to 5 per day.   We can work on the smoking.

## 2020-07-24 ENCOUNTER — Encounter: Payer: Self-pay | Admitting: Physical Therapy

## 2020-07-24 ENCOUNTER — Ambulatory Visit (INDEPENDENT_AMBULATORY_CARE_PROVIDER_SITE_OTHER): Payer: Medicare HMO | Admitting: Physical Therapy

## 2020-07-24 ENCOUNTER — Other Ambulatory Visit: Payer: Self-pay

## 2020-07-24 VITALS — BP 160/84 | HR 63

## 2020-07-24 DIAGNOSIS — R262 Difficulty in walking, not elsewhere classified: Secondary | ICD-10-CM

## 2020-07-24 DIAGNOSIS — M25611 Stiffness of right shoulder, not elsewhere classified: Secondary | ICD-10-CM | POA: Diagnosis not present

## 2020-07-24 DIAGNOSIS — M6281 Muscle weakness (generalized): Secondary | ICD-10-CM | POA: Diagnosis not present

## 2020-07-24 NOTE — Therapy (Signed)
[facility] [address] [place], [place], [address] Phone: [phone]   Fax:  [phone]  Physical Therapy Evaluation  Patient Details  Name: Sue Little MRN: [id number] Date of Birth: 03/15/1940 Referring Provider (PT): Dr. Denyse Amass   Encounter Date: 07/24/2020   PT End of Session - 07/24/20 1346    Visit Number 1    Number of Visits 13    Date for PT Re-Evaluation 09/22/20    Authorization Type [employer]    PT Start Time 1215    PT Stop Time 1305    PT Time Calculation (min) 50 min           Past Medical History:  Diagnosis Date  . Allergy   . Anxiety disorder   . Depression   . History of chicken pox   . Hyperlipidemia   . Hypertension   . Tobacco abuse    started at age 74 years    Past Surgical History:  Procedure Laterality Date  . ABDOMINAL HYSTERECTOMY  1978  . CARDIAC CATHETERIZATION    . esohagus stretched    . TONSILLECTOMY      Vitals:   07/24/20 1245  BP: (!) 160/84  Pulse: 63  SpO2: 97%      Subjective Assessment - 07/24/20 1332    Subjective Pt states the R arm sx came on after the COVID-19 booster from Feb 3rd. Pt states that the pain is directly into the deltoid and down the upper arm and feels like a "stretching pain." She does not feel any pain to the shoulder joinst itself. Pt  denies traumatic MOI, crepitus, locking, instability. Pain does not go below elbow and feels like she can touch it. Pt states that the arm will "grab" and she feels like she can't hold onto her pocket book. She states it is a sharp pain. Pt states she has trouble reaching and fastening her bra. She starts the pain started getting better in the last few weeks. Pt denies NT into fingers but she states her bliateral hands "go to sleep pretty easy." Aggs: fastening bra, reaching, dress, doing hair, carrying, laying onto that side; Eases: N/A not currently performing exercises. Worst 6/10, Current 0/10, Best 0/10. Pt states  she is also having LE pain at this time as well due to PAD. She is unsure how she is going to quit smoking at this time. She smokes ~1 pack a day, but maybe will have 12 cigarettes a day after seeing Dr. Denyse Amass. She is only able to walk about 5 mins at a time before pain comes on. The pain is inconsistent and some days she is able to walk further. The pain feels like cramping, sharp from feet all the way to calves. She states it has been going on for at least 5-10 years at this point. She states she afraid of walking too far and not being able to walk back. She states she has a lot of fear and anxiety associated with her health and future due to her current level of fitness and pain. She states she laments not being able to have a walking partner Pt states she either "sits or lays" for most of the day. She states she tries to stay busy but does not exercise as much as she would like to. She states the pain only comes on with walking. Resting makes it go away.   Pertinent History Previous C/S cortisone injection, >10 years ago    How long  can you walk comfortably? 5-10 mins, depending on day    Diagnostic tests ABIs performed on June 12, 2020.  Right side was 0.7 left side was 0.9; X-ray L/S Mild degenerative change without acute abnormality.    Patient Stated Goals Pt states she would like to walk further without pain and improve R shoulder for daily activities, dressing, self care.    Currently in Pain? No/denies    Pain Orientation Right;Left    Pain Descriptors / Indicators Cramping;Sharp    Pain Onset More than a month ago    Pain Frequency Intermittent    Multiple Pain Sites Yes    Pain Score 0    Pain Location Shoulder    Pain Orientation Right    Pain Descriptors / Indicators Sore    Pain Onset More than a month ago              [facility] PT Assessment - 07/24/20 0001      Assessment   Medical Diagnosis R shoulder pain and bilateral leg pain    Referring Provider (PT) Dr. Denyse Amass    Prior  Therapy [facility] PT for LE      Precautions   Precautions None      Restrictions   Weight Bearing Restrictions No      Balance Screen   Has the patient fallen in the past 6 months No   fell in Oct at apt complex due to uneven cement   Has the patient had a decrease in activity level because of a fear of falling?  No    Is the patient reluctant to leave their home because of a fear of falling?  No      Home [employer] information [employer] residence    Living Arrangements Alone      Prior Function   Level of Independence Independent      Cognition   Overall Cognitive Status Within Functional Limits for tasks assessed      Observation/Other Assessments   Other Surveys  Quick Dash;Lower Extremity Functional Scale    Lower Extremity Functional Scale  TBD    Quick DASH  TBD      Functional Tests   Functional tests Sit to Stand      Sit to Stand   Comments difficulty without UE support      Posture/Postural Control   Posture/Postural Control Postural limitations    Postural Limitations Rounded Shoulders;Increased thoracic kyphosis;Decreased lumbar lordosis      ROM / Strength   AROM / PROM / Strength AROM;PROM;Strength      AROM   Overall AROM Comments R: flex 165 deg, ABD 155 deg, IR L3 reach, ER C5 reach; L/S flex 65% ext 50% R rot 75% L rot 75% R SB 60% L SB 60%      PROM   Overall PROM Comments WFL for R UE      Strength   Overall Strength Comments 4/5 throughout R shoulder, pain with flexion, ABD, ER, and elbow flexion; 4/5 bilateral hips, 4+/5 bilat knee extension, 4/5 bilat knee flexion      Palpation   Palpation comment hypertonicity of R middle and ant deltoid, biceps; none along supra or infra; LE not TTP      Special Tests    Special Tests Rotator Cuff Impingement;Biceps/Labral Tests    Rotator Cuff Impingment tests Leanord Asal test;Empty Can test;Speed's test;Painful Arc of Motion    Biceps/Labral tests Other      Hawkins-Kennedy test  Findings  Negative      Empty Can test   Findings Negative      Speed's test   Findings Positive      Painful Arc of Motion   Findings Positive      other   Findings Negative      Transfers   Five time sit to stand comments  required use of UE, increased fwd trunk lean      Ambulation/Gait   Ambulation Distance (Feet) 35 Feet    Assistive device None    Gait Pattern Step-through pattern;Decreased stride length;Decreased dorsiflexion - right;Decreased dorsiflexion - left    Stairs Yes    Stairs Assistance 7: Independent    Stair Management Technique One rail Right;One rail Left;Alternating pattern   decreased eccentric lowering control                     Objective measurements completed on examination: See above findings.       [facility] Adult PT Treatment/Exercise - 07/24/20 0001      Exercises   Exercises Shoulder      Shoulder Exercises: Stretch   Other Shoulder Stretches biceps table stretch 30s 3x, post cuff stretch 30s 3x      Manual Therapy   Manual Therapy Soft tissue mobilization    Soft tissue mobilization deltoid and biceps on R   decreased pain following, local twitch response elicited >5x each                 PT Education - 07/24/20 1843    Education Details MOI, diagnosis, prognosis, anatomy, exercise progression, DOMS expectations, muscle firing, HEP, POC, smoking cessation, ABI score meaning/implications, walking program introduction    Person(s) Educated Patient    Methods Explanation;Demonstration;Tactile cues;Verbal cues;Handout    Comprehension Verbalized understanding;Returned demonstration;Verbal cues required;Tactile cues required            PT Short Term Goals - 07/24/20 1903      PT SHORT TERM GOAL #1   Title Pt will become independent with HEP in order to demonstrate synthesis of PT education.    Time 2    Period Weeks    Status New      PT SHORT TERM GOAL #2   Title Pt will be able to demonstrate ability to reach BHB  and OH without pain in order to demonstrate functional improvement in UE function for self-care and house hold duties.    Time 3    Period Weeks    Status New             PT Long Term Goals - 07/24/20 1904      PT LONG TERM GOAL #1   Title Pt will be able to demonstrate ability to walk >20 mins in order to demonstrate functional improvement in LE function for self-care and amublation.    Time 6    Period Weeks    Status New      PT LONG TERM GOAL #2   Title Pt will be able to reach [facility] and grab/reach/hold >10 lbs in order to demonstrate functional improvement in R UE function.    Time 6    Period Weeks    Status New      PT LONG TERM GOAL #3   Title Pt will be able to perform 5XSTS in under 12s in order to demonstrate functional improvement above the cut off score for older adults.    Time 6    Period  Weeks    Status New                  Plan - 07/24/20 1844    Clinical Impression Statement Pt is a 80 yo female presenting to PT eval for CC of R shoulder pain and bilateral LE pain. Pt presents with decreased R shoulder and L/S ROM, decreased UE and LE strength, increased muscle spasm, and bilateral LE weaknes. Pt's s/s appear consistent with localized spasm of the deltoid and biceps and LE pain and weakness in alignment with history of PAD. Clinical testing does not suggest internal derangement of the shoulder, but there is potential for L/S radiculopathy also affect LE performance due to L/S stiffness and pain. Due to time, skilled therapy will continue to assess for L/S radic though PAD is more likely contributor. Pt gave verbal understanding to smoking cessation and appears motivated to begin walking program despite being apprehensive of potential pain irritation. Continued positive reinforcement and cuing will likely be needed. Pt's impairments limit her ability to perform self care/dressing/grooming, ambulate safely in the community, as well as participate in daily tasks  like shopping and exercise.    Personal Factors and Comorbidities Age;Comorbidity 1;Time since onset of injury/illness/exacerbation;Behavior Pattern;Past/Current Experience    Examination-Activity Limitations Bathing;Lift;Stand;Locomotion Level;Reach Overhead;Carry;Sleep;Squat;Stairs;Dressing    Examination-Participation Restrictions Community Activity;Meal Prep;Yard Work;Shop;Cleaning    Stability/Clinical Decision Making Evolving/Moderate complexity    Clinical Decision Making Moderate    Rehab Potential Fair    PT Frequency 2x / week    PT Duration 6 weeks    PT Treatment/Interventions ADLs/Self Care Home Management;Cryotherapy;Electrical Stimulation;Iontophoresis 4mg /ml Dexamethasone;Moist Heat;Traction;Ultrasound;Aquatic Therapy;Gait training;Stair training;Functional mobility training;Therapeutic activities;Therapeutic exercise;Balance training;Neuromuscular re-education;Patient/family education;Orthotic Fit/Training;Manual techniques;Scar mobilization;Passive range of motion;Dry needling;Taping;Joint Manipulations;Spinal Manipulations    PT Next Visit Plan review HEP, table ER, wall push up,   PT Home Exercise Plan [email or web address]    Consulted and Agree with Plan of Care Patient           Patient will benefit from skilled therapeutic intervention in order to improve the following deficits and impairments:  Abnormal gait,Pain,Improper body mechanics,Hypomobility,Difficulty walking,Decreased balance,Impaired flexibility,Decreased strength,Decreased range of motion,Decreased activity tolerance,Decreased endurance,Postural dysfunction,Increased muscle spasms  Visit Diagnosis: Decreased right shoulder range of motion  Difficulty walking  Muscle weakness (generalized)     Problem List Patient Active Problem List   Diagnosis Date Noted  . PAD (peripheral artery disease) (HCC) 07/18/2020  . PVC (premature ventricular contraction) 07/13/2019  . Hyperlipidemia 07/13/2019  . Tobacco  abuse 07/05/2019  . Essential hypertension 07/05/2019  . Anxiety 07/05/2019  . Neck pain 07/05/2019  . Depression, major, single episode, mild (HCC) 07/05/2019    07/07/2019 PT, DPT 07/24/20 7:19 PM   [facility] [address] [place], [place], [place] Phone: [phone]   Fax:  [phone]  Name: Rashan Patient MRN: Marge Duncans Date of Birth: 06-24-1940    Referring diagnosis? Chronic R shoulder pain Treatment diagnosis? (if different than referring diagnosis) M25.611, R26.2, M62.81 What was this (referring dx) caused by? []  Surgery []  Fall [x]  Ongoing issue []  Arthritis []  Other: ____________  Laterality: [x]  Rt UE []  Lt [x]  Both LE  Check all possible CPT codes:      []  97110 (Therapeutic Exercise)  []  92507 (SLP Treatment)  []  01/10/1941 (Neuro Re-ed)   []  92526 (Swallowing Treatment)   []  97116 (Gait Training)   []  (Cognitive Training, 1st 15 minutes) []  97140 (Manual Therapy)   []  97130 (Cognitive Training,  each add'l 15 minutes)  []  97530 (Therapeutic Activities)  []  Other, List CPT Code ____________    []  97535 (Self Care)       [x]  All codes above (97110 - 97535)  [x]  97012 (Mechanical Traction)  [x]  97014 (E-stim Unattended)  [x]  97032 (E-stim manual)  [x]  97033 (Ionto)  [x]  97035 (Ultrasound)  [x]  97760 ) []  (Physical Performance Training) [x]  (Aquatic Therapy) []  97034 (Contrast Bath) []  (Paraffin) []  97597 (Wound Care 1st 20 sq cm) []  97598 (Wound Care each add'l 20 sq cm) []  97016 (Vasopneumatic Device) []  [phone] ) []  (Prosthetic Training)

## 2020-07-24 NOTE — Patient Instructions (Signed)
Access Code: [email or web address] URL: [email or web address] Date: 07/24/2020 Prepared by: Zebedee Iba  Exercises Standing Shoulder Posterior Capsule Stretch - 2 x daily - 7 x weekly - 1 sets - 3 reps - 30 hold Bicep Stretch at Table - 2 x daily - 7 x weekly - 1 sets - 3 reps - 30 hold

## 2020-07-26 ENCOUNTER — Ambulatory Visit: Payer: Medicare HMO | Admitting: Psychologist

## 2020-07-27 ENCOUNTER — Ambulatory Visit (INDEPENDENT_AMBULATORY_CARE_PROVIDER_SITE_OTHER): Payer: Medicare HMO | Admitting: Physical Therapy

## 2020-07-27 ENCOUNTER — Encounter: Payer: Self-pay | Admitting: Physical Therapy

## 2020-07-27 ENCOUNTER — Other Ambulatory Visit: Payer: Self-pay

## 2020-07-27 DIAGNOSIS — R262 Difficulty in walking, not elsewhere classified: Secondary | ICD-10-CM

## 2020-07-27 DIAGNOSIS — M6281 Muscle weakness (generalized): Secondary | ICD-10-CM | POA: Diagnosis not present

## 2020-07-27 DIAGNOSIS — M25611 Stiffness of right shoulder, not elsewhere classified: Secondary | ICD-10-CM

## 2020-07-27 NOTE — Therapy (Signed)
[facility] [address] [place], [place], [address] Phone: [phone]   Fax:  [phone]  Physical Therapy Treatment  Patient Details  Name: Sue Little MRN: [id number] Date of Birth: August 14, 1940 Referring Provider (PT): Dr. Denyse Amass   Encounter Date: 07/27/2020   PT End of Session - 07/27/20 1248    Visit Number 2    Number of Visits 13    Date for PT Re-Evaluation 09/22/20    Authorization Type [employer]    PT Start Time 1215    PT Stop Time 1300    PT Time Calculation (min) 45 min    Activity Tolerance Patient tolerated treatment well    Behavior During Therapy [facility] for tasks assessed/performed           Past Medical History:  Diagnosis Date  . Allergy   . Anxiety disorder   . Depression   . History of chicken pox   . Hyperlipidemia   . Hypertension   . Tobacco abuse    started at age 36 years    Past Surgical History:  Procedure Laterality Date  . ABDOMINAL HYSTERECTOMY  1978  . CARDIAC CATHETERIZATION    . esohagus stretched    . TONSILLECTOMY      There were no vitals filed for this visit.   Subjective Assessment - 07/27/20 1215    Subjective Pt states that she has been compliant with HEP but is a little confused about the arm exercises. She states she has been increasing her walking as able and tried to increase it while walking her dog. She is able to maybe get about 10 mins worth.    Pertinent History Previous C/S cortisone injection, >10 years ago    How long can you walk comfortably? 5-10 mins, depending on day    Diagnostic tests ABIs performed on June 12, 2020.  Right side was 0.7 left side was 0.9; X-ray L/S Mild degenerative change without acute abnormality.    Patient Stated Goals Pt states she would like to walk further without pain and improve R shoulder for daily activities, dressing, self care.    Currently in Pain? No/denies                      [facility] PT Assessment - 07/27/20 0001       Observation/Other Assessments   Lower Extremity Functional Scale  37/80 46.3%    Quick DASH  43.2/100 43.2%                       6 Minute walk- Post Test   6 Minute Walk Post Test yes      6 minute walk test results    Aerobic Endurance Distance Walked 240    Endurance additional comments meters, 22.5 laps; cramping R>L             Gait/ambulation: 240 meters, antalgic gait with decreased R stance time at ~3 mins of walking            [facility] Adult PT Treatment/Exercise - 07/27/20 0001      Transfers   Five time sit to stand comments  required use of UE, increased fwd trunk lean      Ambulation/Gait   Ambulation Distance (Feet) 35 Feet    Assistive device None    Gait Pattern Step-through pattern;Decreased stride length;Decreased dorsiflexion - right;Decreased dorsiflexion - left      Posture/Postural Control   Posture/Postural Control  Postural limitations    Postural Limitations Rounded Shoulders;Increased thoracic kyphosis;Decreased lumbar lordosis      Exercises   Exercises Shoulder      Shoulder Exercises: Stretch   Table Stretch -Flexion Limitations 10x 5s    Other Shoulder Stretches biceps table stretch 30s 3x, post cuff stretch 30s 3x    Other Shoulder Stretches table ER stretch 10x 5s hold      Manual Therapy   Manual Therapy Soft tissue mobilization    Soft tissue mobilization deltoid and biceps on R   decreased pain following, local twitch response elicited >5x each                 PT Education - 07/27/20 1247    Education Details anatomy, exercise progression,HEP,  smoking cessation, walking program introduction/community resources, test results    Person(s) Educated Patient    Methods Explanation;Demonstration;Tactile cues;Verbal cues;Handout    Comprehension Verbalized understanding;Returned demonstration;Verbal cues required;Tactile cues required            PT Short Term Goals - 07/27/20 1318      PT SHORT TERM GOAL #1    Title Pt will become independent with HEP in order to demonstrate synthesis of PT education.    Time 2    Period Weeks    Status New      PT SHORT TERM GOAL #2   Title Pt will be able to demonstrate ability to reach BHB and OH without pain in order to demonstrate functional improvement in UE function for self-care and house hold duties.    Time 3    Period Weeks    Status New      PT SHORT TERM GOAL #3   Title Pt will demonstrate at least a 16  pt improvement in Quick DASH in order to demonstrate a clinically significant change in UE function.    Time 3    Period Weeks    Status New             PT Long Term Goals - 07/27/20 1318      PT LONG TERM GOAL #1   Title Pt will be able to demonstrate ability to walk >20 mins in order to demonstrate functional improvement in LE function for self-care and amublation.    Time 6    Period Weeks    Status New      PT LONG TERM GOAL #2   Title Pt will be able to reach [facility] and grab/reach/hold >10 lbs in order to demonstrate functional improvement in R UE function.    Time 6    Period Weeks    Status New      PT LONG TERM GOAL #3   Title Pt will be able to perform 5XSTS in under 12s in order to demonstrate functional improvement above the cut off score for older adults.    Time 6    Period Weeks    Status New      PT LONG TERM GOAL #4   Title Pt will demonstrate at least 471 m during in order to demonstrate reaching normative walking distance for her age group.    Time 6    Period Weeks                 Plan - 07/27/20 1314    Clinical Impression Statement Pt required review of HEP and needed increased cuing for form and technique. Pt was able to incorporate more R shoulder stretching to  improve OH and reaching ROM. Pt had increased soft tissue extensibility following STM and reported decreased pain and stiffness following manual therapy and exercise. Pt's is below cut off score for older adults with claudication  occuring within 3 mins of beginning walking. Walking program and community resources guide provided in order help promote better health habits. Smoking cessation reinforcement provided again today. Plan to incorporate cardiovascular fitness as able and progress R UE strengthening and ROM as needed. Pt would benefit from continued skilled therapy in order to reach goals and maximize functional UE and LE strength and mobility for prevention of future functional decline.    Personal Factors and Comorbidities Age;Comorbidity 1;Time since onset of injury/illness/exacerbation;Behavior Pattern;Past/Current Experience    Examination-Activity Limitations Bathing;Lift;Stand;Locomotion Level;Reach Overhead;Carry;Sleep;Squat;Stairs;Dressing    Examination-Participation Restrictions Community Activity;Meal Prep;Yard Work;Shop;Cleaning    Stability/Clinical Decision Making Evolving/Moderate complexity    Rehab Potential Fair    PT Frequency 2x / week    PT Duration 6 weeks    PT Treatment/Interventions ADLs/Self Care Home Management;Cryotherapy;Electrical Stimulation;Iontophoresis 4mg /ml Dexamethasone;Moist Heat;Traction;Ultrasound;Aquatic Therapy;Gait training;Stair training;Functional mobility training;Therapeutic activities;Therapeutic exercise;Balance training;Neuromuscular re-education;Patient/family education;Orthotic Fit/Training;Manual techniques;Scar mobilization;Passive range of motion;Dry needling;Taping;Joint Manipulations;Spinal Manipulations    PT Next Visit Plan review HEP, wall push up   PT Home Exercise Plan [email or web address]    Consulted and Agree with Plan of Care Patient           Patient will benefit from skilled therapeutic intervention in order to improve the following deficits and impairments:  Abnormal gait,Pain,Improper body mechanics,Hypomobility,Difficulty walking,Decreased balance,Impaired flexibility,Decreased strength,Decreased range of motion,Decreased activity tolerance,Decreased  endurance,Postural dysfunction,Increased muscle spasms  Visit Diagnosis: Decreased right shoulder range of motion  Difficulty walking  Muscle weakness (generalized)     Problem List Patient Active Problem List   Diagnosis Date Noted  . PAD (peripheral artery disease) (HCC) 07/18/2020  . PVC (premature ventricular contraction) 07/13/2019  . Hyperlipidemia 07/13/2019  . Tobacco abuse 07/05/2019  . Essential hypertension 07/05/2019  . Anxiety 07/05/2019  . Neck pain 07/05/2019  . Depression, major, single episode, mild (HCC) 07/05/2019    07/07/2019 PT, DPT 07/27/20 1:22 PM   [facility] [address] [place], [place], [place] Phone: [phone]   Fax:  [phone]  Name: Sue Little MRN: Marge Duncans Date of Birth: [birth year]

## 2020-07-27 NOTE — Patient Instructions (Signed)
Access Code: [email or web address] URL: [email or web address] Date: 07/27/2020 Prepared by: Zebedee Iba  Exercises Standing Shoulder Posterior Capsule Stretch - 2 x daily - 7 x weekly - 1 sets - 3 reps - 30 hold Bicep Stretch at Table - 2 x daily - 7 x weekly - 1 sets - 3 reps - 30 hold Seated Shoulder Flexion Slide at Table Top with Forearm in Neutral - 2 x daily - 7 x weekly - 1 sets - 10 reps - 5s hold Seated Shoulder External Rotation PROM on Table - 2 x daily - 7 x weekly - 1 sets - 10 reps - 5s hold

## 2020-07-31 ENCOUNTER — Ambulatory Visit (INDEPENDENT_AMBULATORY_CARE_PROVIDER_SITE_OTHER): Payer: Medicare HMO | Admitting: Physical Therapy

## 2020-07-31 ENCOUNTER — Other Ambulatory Visit: Payer: Self-pay

## 2020-07-31 ENCOUNTER — Encounter: Payer: Self-pay | Admitting: Physical Therapy

## 2020-07-31 DIAGNOSIS — M25611 Stiffness of right shoulder, not elsewhere classified: Secondary | ICD-10-CM

## 2020-07-31 DIAGNOSIS — R262 Difficulty in walking, not elsewhere classified: Secondary | ICD-10-CM | POA: Diagnosis not present

## 2020-07-31 DIAGNOSIS — M6281 Muscle weakness (generalized): Secondary | ICD-10-CM | POA: Diagnosis not present

## 2020-07-31 DIAGNOSIS — I10 Essential (primary) hypertension: Secondary | ICD-10-CM

## 2020-07-31 NOTE — Therapy (Signed)
[facility] [address] [place], [place], [address] Phone: [phone]   Fax:  [phone]  Physical Therapy Treatment  Patient Details  Name: Sue Little MRN: [id number] Date of Birth: 1940-08-15 Referring Provider (PT): Dr. Denyse Amass   Encounter Date: 07/31/2020   PT End of Session - 07/31/20 1320    Visit Number 3    Number of Visits 13    Date for PT Re-Evaluation 09/22/20    Authorization Type [employer]    PT Start Time 1216    PT Stop Time 1301    PT Time Calculation (min) 45 min    Activity Tolerance Patient tolerated treatment well    Behavior During Therapy [facility] for tasks assessed/performed           Past Medical History:  Diagnosis Date  . Allergy   . Anxiety disorder   . Depression   . History of chicken pox   . Hyperlipidemia   . Hypertension   . Tobacco abuse    started at age 64 years    Past Surgical History:  Procedure Laterality Date  . ABDOMINAL HYSTERECTOMY  1978  . CARDIAC CATHETERIZATION    . esohagus stretched    . TONSILLECTOMY      There were no vitals filed for this visit.   Subjective Assessment - 07/31/20 1213    Subjective Pt states the R shoulder feels much better. She is almost able to hook up her bra at this point.    Pertinent History Previous C/S cortisone injection, >10 years ago    How long can you walk comfortably? 5-10 mins, depending on day    Diagnostic tests ABIs performed on June 12, 2020.  Right side was 0.7 left side was 0.9; X-ray L/S Mild degenerative change without acute abnormality.    Patient Stated Goals Pt states she would like to walk further without pain and improve R shoulder for daily activities, dressing, self care.    Currently in Pain? No/denies    Pain Onset More than a month ago    Pain Score 0    Pain Onset More than a month ago                             [facility] Adult PT Treatment/Exercise - 07/31/20 0001      Posture/Postural  Control   Posture/Postural Control Postural limitations    Postural Limitations Rounded Shoulders;Increased thoracic kyphosis;Decreased lumbar lordosis      Exercises   Exercises Shoulder;Knee/Hip      Knee/Hip Exercises: Standing   Other Standing Knee Exercises STS 2x10      Shoulder Exercises: Sidelying   External Rotation --    External Rotation Limitations --      Shoulder Exercises: Standing   Row 20 reps    Theraband Level (Shoulder Row) Level 3 (Green)      Shoulder Exercises: Stretch   Table Stretch -Flexion Limitations 10x 5s    Other Shoulder Stretches biceps table stretch 30s 3x, post cuff stretch 30s 3x    Other Shoulder Stretches table ER stretch 10x 5s hold      Manual Therapy   Manual Therapy Soft tissue mobilization    Soft tissue mobilization Ant, lateral deltoid and biceps on R   decreased pain and improved ROM following, local twitch response elicited                 PT Education -  07/31/20 1317    Education Details anatomy, exercise progression,HEP, smoking cessation, walking program introduction/community resources, pysch resources for smoking, reinforcement for walking    Person(s) Educated Patient    Methods Explanation;Demonstration;Tactile cues;Verbal cues;Handout    Comprehension Verbalized understanding;Returned demonstration;Verbal cues required;Tactile cues required            PT Short Term Goals - 07/27/20 1318      PT SHORT TERM GOAL #1   Title Pt will become independent with HEP in order to demonstrate synthesis of PT education.    Time 2    Period Weeks    Status New      PT SHORT TERM GOAL #2   Title Pt will be able to demonstrate ability to reach BHB and OH without pain in order to demonstrate functional improvement in UE function for self-care and house hold duties.    Time 3    Period Weeks    Status New      PT SHORT TERM GOAL #3   Title Pt will demonstrate at least a 16  pt improvement in Quick DASH in order to  demonstrate a clinically significant change in UE function.    Time 3    Period Weeks    Status New             PT Long Term Goals - 07/27/20 1318      PT LONG TERM GOAL #1   Title Pt will be able to demonstrate ability to walk >20 mins in order to demonstrate functional improvement in LE function for self-care and amublation.    Time 6    Period Weeks    Status New      PT LONG TERM GOAL #2   Title Pt will be able to reach [facility] and grab/reach/hold >10 lbs in order to demonstrate functional improvement in R UE function.    Time 6    Period Weeks    Status New      PT LONG TERM GOAL #3   Title Pt will be able to perform 5XSTS in under 12s in order to demonstrate functional improvement above the cut off score for older adults.    Time 6    Period Weeks    Status New      PT LONG TERM GOAL #4   Title Pt will demonstrate at least 471 m during in order to demonstrate reaching normative walking distance for her age group.    Time 6    Period Weeks                 Plan - 07/31/20 1248    Clinical Impression Statement Pt was able to demonstrate improved R shoulder ROM after manual therapy and stretching exercise. Pt's R deltoid appears to be the most limited muscle group, with biggest improvement in ROM following lateral deltoid STM. Pt was able to incorporate more open pack shoulder position stretching as well as rowing without increased pain. STS also introdcued to HEP in order to promote more LE strengthening and movement throughout day. Pt would benefit from continued skilled therapy in order to reach goals and maximize functional UE and LE strength and mobility for prevention of future functional decline.    Personal Factors and Comorbidities Age;Comorbidity 1;Time since onset of injury/illness/exacerbation;Behavior Pattern;Past/Current Experience    Examination-Activity Limitations Bathing;Lift;Stand;Locomotion Level;Reach Overhead;Carry;Sleep;Squat;Stairs;Dressing     Examination-Participation Restrictions Community Activity;Meal Prep;Yard Work;Shop;Cleaning    Stability/Clinical Decision Making Evolving/Moderate complexity    Rehab  Potential Fair    PT Frequency 2x / week    PT Duration 6 weeks    PT Treatment/Interventions ADLs/Self Care Home Management;Cryotherapy;Electrical Stimulation;Iontophoresis 4mg /ml Dexamethasone;Moist Heat;Traction;Ultrasound;Aquatic Therapy;Gait training;Stair training;Functional mobility training;Therapeutic activities;Therapeutic exercise;Balance training;Neuromuscular re-education;Patient/family education;Orthotic Fit/Training;Manual techniques;Scar mobilization;Passive range of motion;Dry needling;Taping;Joint Manipulations;Spinal Manipulations    PT Next Visit Plan review HEP, table ER, wall push up   PT Home Exercise Plan [email or web address]    Consulted and Agree with Plan of Care Patient           Patient will benefit from skilled therapeutic intervention in order to improve the following deficits and impairments:  Abnormal gait,Pain,Improper body mechanics,Hypomobility,Difficulty walking,Decreased balance,Impaired flexibility,Decreased strength,Decreased range of motion,Decreased activity tolerance,Decreased endurance,Postural dysfunction,Increased muscle spasms  Visit Diagnosis: Decreased right shoulder range of motion  Difficulty walking  Muscle weakness (generalized)  Essential hypertension     Problem List Patient Active Problem List   Diagnosis Date Noted  . PAD (peripheral artery disease) (HCC) 07/18/2020  . PVC (premature ventricular contraction) 07/13/2019  . Hyperlipidemia 07/13/2019  . Tobacco abuse 07/05/2019  . Essential hypertension 07/05/2019  . Anxiety 07/05/2019  . Neck pain 07/05/2019  . Depression, major, single episode, mild (HCC) 07/05/2019   07/07/2019 PT, DPT 07/31/20 1:22 PM    [facility] [address] [place], [place], [place] Phone:  [phone]   Fax:  [phone]  Name: Sue Little MRN: Marge Duncans Date of Birth: 1940/12/19

## 2020-07-31 NOTE — Patient Instructions (Signed)
Access Code: [email or web address] URL: [email or web address] Date: 07/31/2020 Prepared by: Zebedee Iba  Exercises Standing Shoulder Posterior Capsule Stretch - 2 x daily - 7 x weekly - 1 sets - 3 reps - 30 hold Bicep Stretch at Table - 2 x daily - 7 x weekly - 1 sets - 3 reps - 30 hold Seated Shoulder Flexion Slide at Table Top with Forearm in Neutral - 2 x daily - 7 x weekly - 1 sets - 10 reps - 5s hold Seated Shoulder External Rotation PROM on Table - 2 x daily - 7 x weekly - 1 sets - 10 reps - 5s hold Sit to Stand - 1 x daily - 7 x weekly - 2 sets - 10 reps

## 2020-08-03 ENCOUNTER — Other Ambulatory Visit: Payer: Self-pay

## 2020-08-03 ENCOUNTER — Encounter: Payer: Self-pay | Admitting: Physical Therapy

## 2020-08-03 ENCOUNTER — Ambulatory Visit (INDEPENDENT_AMBULATORY_CARE_PROVIDER_SITE_OTHER): Payer: Medicare HMO | Admitting: Physical Therapy

## 2020-08-03 DIAGNOSIS — M6281 Muscle weakness (generalized): Secondary | ICD-10-CM

## 2020-08-03 DIAGNOSIS — R262 Difficulty in walking, not elsewhere classified: Secondary | ICD-10-CM

## 2020-08-03 DIAGNOSIS — M25611 Stiffness of right shoulder, not elsewhere classified: Secondary | ICD-10-CM | POA: Diagnosis not present

## 2020-08-03 NOTE — Patient Instructions (Signed)
Access Code: [email or web address] URL: [email or web address] Date: 08/03/2020 Prepared by: Zebedee Iba  Exercises Standing Shoulder Posterior Capsule Stretch - 2 x daily - 7 x weekly - 1 sets - 3 reps - 30 hold Shoulder Flexion Wall Slide with Towel - 2 x daily - 7 x weekly - 1 sets - 10 reps - 5 hold Seated Shoulder External Rotation PROM on Table - 2 x daily - 7 x weekly - 1 sets - 10 reps - 5s hold Sit to Stand - 1 x daily - 7 x weekly - 2 sets - 10 reps Single Arm Doorway Pec Stretch at 90 Degrees Abduction - 2 x daily - 7 x weekly - 1 sets - 3 reps - 30 hold

## 2020-08-03 NOTE — Therapy (Signed)
[facility] [address] [place], [place], [address] Phone: [phone]   Fax:  [phone]  Physical Therapy Treatment  Patient Details  Name: Sue Little MRN: [id number] Date of Birth: 1941/01/12 Referring Provider (PT): Dr. Denyse Amass   Encounter Date: 08/03/2020   PT End of Session - 08/03/20 1248    Visit Number 4    Number of Visits 13    Date for PT Re-Evaluation 09/22/20    Authorization Type [employer]    PT Start Time 1215    PT Stop Time 1300    PT Time Calculation (min) 45 min    Activity Tolerance Patient tolerated treatment well    Behavior During Therapy [facility] for tasks assessed/performed           Past Medical History:  Diagnosis Date  . Allergy   . Anxiety disorder   . Depression   . History of chicken pox   . Hyperlipidemia   . Hypertension   . Tobacco abuse    started at age 80 years    Past Surgical History:  Procedure Laterality Date  . ABDOMINAL HYSTERECTOMY  1978  . CARDIAC CATHETERIZATION    . esohagus stretched    . TONSILLECTOMY      There were no vitals filed for this visit.   Subjective Assessment - 08/03/20 1215    Subjective Pt states the shoulder is doing better and better. She is able to [facility] her bra at this point. She states mixed compliance with HEP due to lost printout.    Pertinent History Previous C/S cortisone injection, >10 years ago    How long can you walk comfortably? 5-10 mins, depending on day    Diagnostic tests ABIs performed on June 12, 2020.  Right side was 0.7 left side was 0.9; X-ray L/S Mild degenerative change without acute abnormality.    Patient Stated Goals Pt states she would like to walk further without pain and improve R shoulder for daily activities, dressing, self care.    Pain Onset More than a month ago    Pain Onset More than a month ago                             [facility] Adult PT Treatment/Exercise - 08/03/20 0001       Posture/Postural Control   Posture/Postural Control Postural limitations    Postural Limitations Rounded Shoulders;Increased thoracic kyphosis;Decreased lumbar lordosis      Exercises   Exercises Shoulder;Knee/Hip      Knee/Hip Exercises: Standing   Other Standing Knee Exercises STS 2x10      Shoulder Exercises: Standing   Row 20 reps    Theraband Level (Shoulder Row) Level 3 (Green)      Shoulder Exercises: Stretch   Table Stretch -Flexion Limitations 10x 5s    Other Shoulder Stretches biceps table stretch 30s 3x, post cuff stretch 30s 3x    Other Shoulder Stretches table ER stretch 10x 5s hold  Wall slide flexion 10x 10s     Manual Therapy   Manual Therapy Soft tissue mobilization;Joint mobilization    Joint Mobilization grade II R GHJ inf    Soft tissue mobilization deltoid and biceps on R   decreased pain following, local twitch response elicited >5x each                 PT Education - 08/03/20 1243    Education Details exercise progression,HEP, smoking  cessation, walking program introduction/community resources, pysch resources for smoking, reinforcement for walking    Person(s) Educated Patient    Methods Explanation;Demonstration;Tactile cues;Verbal cues;Handout    Comprehension Verbalized understanding;Returned demonstration;Verbal cues required;Tactile cues required            PT Short Term Goals - 07/27/20 1318      PT SHORT TERM GOAL #1   Title Pt will become independent with HEP in order to demonstrate synthesis of PT education.    Time 2    Period Weeks    Status New      PT SHORT TERM GOAL #2   Title Pt will be able to demonstrate ability to reach BHB and OH without pain in order to demonstrate functional improvement in UE function for self-care and house hold duties.    Time 3    Period Weeks    Status New      PT SHORT TERM GOAL #3   Title Pt will demonstrate at least a 16  pt improvement in Quick DASH in order to demonstrate a clinically  significant change in UE function.    Time 3    Period Weeks    Status New             PT Long Term Goals - 07/27/20 1318      PT LONG TERM GOAL #1   Title Pt will be able to demonstrate ability to walk >20 mins in order to demonstrate functional improvement in LE function for self-care and amublation.    Time 6    Period Weeks    Status New      PT LONG TERM GOAL #2   Title Pt will be able to reach [facility] and grab/reach/hold >10 lbs in order to demonstrate functional improvement in R UE function.    Time 6    Period Weeks    Status New      PT LONG TERM GOAL #3   Title Pt will be able to perform 5XSTS in under 12s in order to demonstrate functional improvement above the cut off score for older adults.    Time 6    Period Weeks    Status New      PT LONG TERM GOAL #4   Title Pt will demonstrate at least 471 m during in order to demonstrate reaching normative walking distance for her age group.    Time 6    Period Weeks                 Plan - 08/03/20 1250    Clinical Impression Statement Pt was able to progress R shoulder ROM and strengthening exercise. Pt did not respond well to joint mobilization due to unclear response to manual therapy. Pt was unsure if it was pain into the arm. Pt does present with R UE skin spots with indistinct borders and scaly appearance, so she was instructed to contact dermatologist in order to asess for potential melanoma. Pt required review and extensive cuing for HEP due to mixed compliance. Pt HEP updated and new handout provided.  Pt would benefit from continued skilled therapy in order to reach goals and maximize functional UE and LE strength and mobility for prevention of future functional decline.    Personal Factors and Comorbidities Age;Comorbidity 1;Time since onset of injury/illness/exacerbation;Behavior Pattern;Past/Current Experience    Examination-Activity Limitations Bathing;Lift;Stand;Locomotion Level;Reach  Overhead;Carry;Sleep;Squat;Stairs;Dressing    Examination-Participation Restrictions Community Activity;Meal Prep;Yard Work;Shop;Cleaning    Stability/Clinical Decision Making Evolving/Moderate complexity  Rehab Potential Fair    PT Frequency 2x / week    PT Duration 6 weeks    PT Treatment/Interventions ADLs/Self Care Home Management;Cryotherapy;Electrical Stimulation;Iontophoresis 4mg /ml Dexamethasone;Moist Heat;Traction;Ultrasound;Aquatic Therapy;Gait training;Stair training;Functional mobility training;Therapeutic activities;Therapeutic exercise;Balance training;Neuromuscular re-education;Patient/family education;Orthotic Fit/Training;Manual techniques;Scar mobilization;Passive range of motion;Dry needling;Taping;Joint Manipulations;Spinal Manipulations    PT Next Visit Plan review HEP, wall push up    PT Home Exercise Plan [email or web address]    Consulted and Agree with Plan of Care Patient           Patient will benefit from skilled therapeutic intervention in order to improve the following deficits and impairments:  Abnormal gait,Pain,Improper body mechanics,Hypomobility,Difficulty walking,Decreased balance,Impaired flexibility,Decreased strength,Decreased range of motion,Decreased activity tolerance,Decreased endurance,Postural dysfunction,Increased muscle spasms  Visit Diagnosis: Decreased right shoulder range of motion  Difficulty walking  Muscle weakness (generalized)     Problem List Patient Active Problem List   Diagnosis Date Noted  . PAD (peripheral artery disease) (HCC) 07/18/2020  . PVC (premature ventricular contraction) 07/13/2019  . Hyperlipidemia 07/13/2019  . Tobacco abuse 07/05/2019  . Essential hypertension 07/05/2019  . Anxiety 07/05/2019  . Neck pain 07/05/2019  . Depression, major, single episode, mild (HCC) 07/05/2019    07/07/2019 PT, DPT 08/03/20 1:08 PM   [facility] [address] [place], [place],  [place] Phone: [phone]   Fax:  [phone]  Name: Sue Little MRN: Marge Duncans Date of Birth: 1940-03-20

## 2020-08-09 ENCOUNTER — Other Ambulatory Visit: Payer: Self-pay | Admitting: Physician Assistant

## 2020-08-10 ENCOUNTER — Encounter: Payer: Self-pay | Admitting: Physical Therapy

## 2020-08-10 ENCOUNTER — Ambulatory Visit (INDEPENDENT_AMBULATORY_CARE_PROVIDER_SITE_OTHER): Payer: Medicare HMO | Admitting: Physical Therapy

## 2020-08-10 DIAGNOSIS — R262 Difficulty in walking, not elsewhere classified: Secondary | ICD-10-CM | POA: Diagnosis not present

## 2020-08-10 DIAGNOSIS — M6281 Muscle weakness (generalized): Secondary | ICD-10-CM

## 2020-08-10 DIAGNOSIS — M25611 Stiffness of right shoulder, not elsewhere classified: Secondary | ICD-10-CM | POA: Diagnosis not present

## 2020-08-10 NOTE — Therapy (Signed)
[facility] [address] [place], [place], [address] Phone: [phone]   Fax:  [phone]  Physical Therapy Treatment  Patient Details  Name: Sue Little MRN: [id number] Date of Birth: 1941/02/14 Referring Provider (PT): Dr. Denyse Amass   Encounter Date: 08/10/2020   PT End of Session - 08/10/20 1235    Visit Number 5    Number of Visits 13    Date for PT Re-Evaluation 09/22/20    Authorization Type [employer]    PT Start Time 1215    PT Stop Time 1300    PT Time Calculation (min) 45 min    Activity Tolerance Patient tolerated treatment well    Behavior During Therapy [facility] for tasks assessed/performed           Past Medical History:  Diagnosis Date  . Allergy   . Anxiety disorder   . Depression   . History of chicken pox   . Hyperlipidemia   . Hypertension   . Tobacco abuse    started at age 55 years    Past Surgical History:  Procedure Laterality Date  . ABDOMINAL HYSTERECTOMY  1978  . CARDIAC CATHETERIZATION    . esohagus stretched    . TONSILLECTOMY      There were no vitals filed for this visit.   Subjective Assessment - 08/10/20 1215    Subjective Pt states the shoulder is better but has not done HEP at all. She states she has been walking more with walking her dog.    Pertinent History Previous C/S cortisone injection, >10 years ago    How long can you walk comfortably? 5-10 mins, depending on day    Diagnostic tests ABIs performed on June 12, 2020.  Right side was 0.7 left side was 0.9; X-ray L/S Mild degenerative change without acute abnormality.    Patient Stated Goals Pt states she would like to walk further without pain and improve R shoulder for daily activities, dressing, self care.    Currently in Pain? No/denies    Pain Orientation Right    Pain Descriptors / Indicators Aching    Pain Onset More than a month ago    Pain Onset More than a month ago                              [facility] Adult PT Treatment/Exercise - 08/10/20 0001      Posture/Postural Control   Posture/Postural Control Postural limitations    Postural Limitations Rounded Shoulders;Increased thoracic kyphosis;Decreased lumbar lordosis      Exercises   Exercises Shoulder;Knee/Hip      Knee/Hip Exercises: Aerobic   Recumbent Bike L1 8   0.7 miles     Knee/Hip Exercises: Standing   Other Standing Knee Exercises STS 2x10      Shoulder Exercises: Standing   Row 20 reps    Theraband Level (Shoulder Row) Level 3 (Green)      Shoulder Exercises: Pulleys   Flexion 2 minutes    ABduction 2 minutes      Shoulder Exercises: Stretch   Table Stretch -Flexion Limitations 10x 5s    Other Shoulder Stretches biceps table stretch 30s 3x, post cuff stretch 30s 3x    Other Shoulder Stretches table ER stretch 10s 5x hold      Manual Therapy   Manual Therapy Soft tissue mobilization;Joint mobilization    Joint Mobilization grade II R GHJ inf    Soft  tissue mobilization deltoid and biceps on R   decreased pain following, local twitch response elicited >5x each                 PT Education - 08/10/20 1234    Education Details exercise progression, HEP compliance, walking program, pedometer usage, reinforcement for walking    Person(s) Educated Patient    Methods Explanation;Demonstration;Tactile cues;Verbal cues    Comprehension Verbalized understanding;Returned demonstration;Verbal cues required;Tactile cues required            PT Short Term Goals - 07/27/20 1318      PT SHORT TERM GOAL #1   Title Pt will become independent with HEP in order to demonstrate synthesis of PT education.    Time 2    Period Weeks    Status New      PT SHORT TERM GOAL #2   Title Pt will be able to demonstrate ability to reach BHB and OH without pain in order to demonstrate functional improvement in UE function for self-care and house hold duties.    Time 3    Period Weeks    Status New      PT SHORT TERM  GOAL #3   Title Pt will demonstrate at least a 16  pt improvement in Quick DASH in order to demonstrate a clinically significant change in UE function.    Time 3    Period Weeks    Status New             PT Long Term Goals - 07/27/20 1318      PT LONG TERM GOAL #1   Title Pt will be able to demonstrate ability to walk >20 mins in order to demonstrate functional improvement in LE function for self-care and amublation.    Time 6    Period Weeks    Status New      PT LONG TERM GOAL #2   Title Pt will be able to reach [facility] and grab/reach/hold >10 lbs in order to demonstrate functional improvement in R UE function.    Time 6    Period Weeks    Status New      PT LONG TERM GOAL #3   Title Pt will be able to perform 5XSTS in under 12s in order to demonstrate functional improvement above the cut off score for older adults.    Time 6    Period Weeks    Status New      PT LONG TERM GOAL #4   Title Pt will demonstrate at least 471 m during in order to demonstrate reaching normative walking distance for her age group.    Time 6    Period Weeks                 Plan - 08/10/20 1236    Clinical Impression Statement Pt's session was focused on reviewing HEP and trying to progress ROM. Pt was able to perform pulleys in flexion and ABD with  reaching end range without pain. Pt continues to have most anterior shoulder and biceps restriction. Pt was able to tolerate recumbent biking for 8 min with report of mild/mod cramping into bilateral feet and calves. Pt required extended rest break following due to pain.  Pt required VC and TC for shoulder exercise technique and form. Pt will need further encouragement for HEP compliance as well as walking program. Pt would benefit from continued skilled therapy in order to reach goals and maximize functional UE  and LE strength and mobility for prevention of future functional decline.    Personal Factors and Comorbidities Age;Comorbidity 1;Time  since onset of injury/illness/exacerbation;Behavior Pattern;Past/Current Experience    Examination-Activity Limitations Bathing;Lift;Stand;Locomotion Level;Reach Overhead;Carry;Sleep;Squat;Stairs;Dressing    Examination-Participation Restrictions Community Activity;Meal Prep;Yard Work;Shop;Cleaning    Stability/Clinical Decision Making Evolving/Moderate complexity    Rehab Potential Fair    PT Frequency 2x / week    PT Duration 6 weeks    PT Treatment/Interventions ADLs/Self Care Home Management;Cryotherapy;Electrical Stimulation;Iontophoresis 4mg /ml Dexamethasone;Moist Heat;Traction;Ultrasound;Aquatic Therapy;Gait training;Stair training;Functional mobility training;Therapeutic activities;Therapeutic exercise;Balance training;Neuromuscular re-education;Patient/family education;Orthotic Fit/Training;Manual techniques;Scar mobilization;Passive range of motion;Dry needling;Taping;Joint Manipulations;Spinal Manipulations    PT Next Visit Plan review HEP, wall push up    PT Home Exercise Plan [email or web address]    Consulted and Agree with Plan of Care Patient           Patient will benefit from skilled therapeutic intervention in order to improve the following deficits and impairments:  Abnormal gait,Pain,Improper body mechanics,Hypomobility,Difficulty walking,Decreased balance,Impaired flexibility,Decreased strength,Decreased range of motion,Decreased activity tolerance,Decreased endurance,Postural dysfunction,Increased muscle spasms  Visit Diagnosis: Decreased right shoulder range of motion  Difficulty walking  Muscle weakness (generalized)     Problem List Patient Active Problem List   Diagnosis Date Noted  . PAD (peripheral artery disease) (HCC) 07/18/2020  . PVC (premature ventricular contraction) 07/13/2019  . Hyperlipidemia 07/13/2019  . Tobacco abuse 07/05/2019  . Essential hypertension 07/05/2019  . Anxiety 07/05/2019  . Neck pain 07/05/2019  . Depression, major, single episode, mild  (HCC) 07/05/2019    07/07/2019 PT, DPT 08/10/20 1:01 PM   [facility] [address] [place], [place], [place] Phone: [phone]   Fax:  [phone]  Name: Willette Mudry MRN: Marge Duncans Date of Birth: 1940/03/30

## 2020-08-14 ENCOUNTER — Encounter: Payer: Medicare HMO | Admitting: Physical Therapy

## 2020-08-17 ENCOUNTER — Encounter: Payer: Self-pay | Admitting: Physical Therapy

## 2020-08-17 ENCOUNTER — Ambulatory Visit: Payer: Medicare HMO | Admitting: Physical Therapy

## 2020-08-17 DIAGNOSIS — R262 Difficulty in walking, not elsewhere classified: Secondary | ICD-10-CM | POA: Diagnosis not present

## 2020-08-17 DIAGNOSIS — M25611 Stiffness of right shoulder, not elsewhere classified: Secondary | ICD-10-CM

## 2020-08-17 DIAGNOSIS — M6281 Muscle weakness (generalized): Secondary | ICD-10-CM

## 2020-08-17 NOTE — Patient Instructions (Signed)
Access Code: [email or web address] URL: [email or web address] Date: 08/17/2020 Prepared by: Zebedee Iba  Exercises Standing Shoulder Posterior Capsule Stretch - 2 x daily - 7 x weekly - 1 sets - 3 reps - 30 hold Single Arm Doorway Pec Stretch at 90 Degrees Abduction - 2 x daily - 7 x weekly - 1 sets - 3 reps - 30 hold Shoulder Flexion Wall Slide with Towel - 1 x daily - 7 x weekly - 1 sets - 10 reps - 5 hold Sit to Stand - 1 x daily - 7 x weekly - 2 sets - 10 reps Shoulder External Rotation and Scapular Retraction with Resistance - 1 x daily - 7 x weekly - 2 sets - 10 reps Heel rises with counter support - 1 x daily - 7 x weekly - 2 sets - 10 reps

## 2020-08-17 NOTE — Therapy (Signed)
[facility] [address] [place], [place], [address] Phone: [phone]   Fax:  [phone]  Physical Therapy Treatment  Patient Details  Name: Sue Little MRN: [id number] Date of Birth: April 05, 1940 Referring Provider (PT): Dr. Denyse Amass   Encounter Date: 08/17/2020   PT End of Session - 08/17/20 1238     Visit Number 6    Number of Visits 13    Date for PT Re-Evaluation 09/22/20    Authorization Type [employer]    PT Start Time 1220    PT Stop Time 1300    PT Time Calculation (min) 40 min    Activity Tolerance Patient tolerated treatment well    Behavior During Therapy WFL for tasks assessed/performed             Past Medical History:  Diagnosis Date   Allergy    Anxiety disorder    Depression    History of chicken pox    Hyperlipidemia    Hypertension    Tobacco abuse    started at age 49 years    Past Surgical History:  Procedure Laterality Date   ABDOMINAL HYSTERECTOMY  1978   CARDIAC CATHETERIZATION     esohagus stretched     TONSILLECTOMY      There were no vitals filed for this visit.   Subjective Assessment - 08/17/20 1223     Subjective Pt states that R shoulder is much better. Able to reach and hook her bra at the time now. She has been more compliant with HEP and walking more.    Pertinent History Previous C/S cortisone injection, >10 years ago    How long can you walk comfortably? 5-10 mins, depending on day    Diagnostic tests ABIs performed on June 12, 2020.  Right side was 0.7 left side was 0.9; X-ray L/S Mild degenerative change without acute abnormality.    Patient Stated Goals Pt states she would like to walk further without pain and improve R shoulder for daily activities, dressing, self care.    Currently in Pain? No/denies    Pain Onset More than a month ago    Pain Onset More than a month ago                               [facility] Adult PT Treatment/Exercise - 08/17/20  0001       Posture/Postural Control   Posture/Postural Control Postural limitations    Postural Limitations Rounded Shoulders;Increased thoracic kyphosis;Decreased lumbar lordosis      Exercises   Exercises Shoulder;Knee/Hip      Knee/Hip Exercises: Aerobic   Recumbent Bike 11   1.02 miles, L1     Knee/Hip Exercises: Standing   Other Standing Knee Exercises STS 2x10    Other Standing Knee Exercises calf raises 10x      Shoulder Exercises: Standing   Row 20 reps    Theraband Level (Shoulder Row) Level 3 (Green)    Other Standing Exercises wall push up 10x    Other Standing Exercises bilat shoulder ER RTB 2x10      Shoulder Exercises: Pulleys   Flexion 3 minutes    ABduction 3 minutes      Shoulder Exercises: Stretch   Corner Stretch Limitations 30s 3x                            PT Education -  08/17/20 1237     Education Details exercise progression, HEP, walking program, pedometer usage, reinforcement for walking, smoking cessation    Person(s) Educated Patient    Methods Explanation;Demonstration;Tactile cues;Verbal cues;Handout    Comprehension Verbalized understanding;Returned demonstration;Verbal cues required;Tactile cues required              PT Short Term Goals - 07/27/20 1318       PT SHORT TERM GOAL #1   Title Pt will become independent with HEP in order to demonstrate synthesis of PT education.    Time 2    Period Weeks    Status New      PT SHORT TERM GOAL #2   Title Pt will be able to demonstrate ability to reach BHB and OH without pain in order to demonstrate functional improvement in UE function for self-care and house hold duties.    Time 3    Period Weeks    Status New      PT SHORT TERM GOAL #3   Title Pt will demonstrate at least a 16  pt improvement in Quick DASH in order to demonstrate a clinically significant change in UE function.    Time 3    Period Weeks    Status New               PT Long Term Goals -  07/27/20 1318       PT LONG TERM GOAL #1   Title Pt will be able to demonstrate ability to walk >20 mins in order to demonstrate functional improvement in LE function for self-care and amublation.    Time 6    Period Weeks    Status New      PT LONG TERM GOAL #2   Title Pt will be able to reach [facility] and grab/reach/hold >10 lbs in order to demonstrate functional improvement in R UE function.    Time 6    Period Weeks    Status New      PT LONG TERM GOAL #3   Title Pt will be able to perform 5XSTS in under 12s in order to demonstrate functional improvement above the cut off score for older adults.    Time 6    Period Weeks    Status New      PT LONG TERM GOAL #4   Title Pt will demonstrate at least 471 m during in order to demonstrate reaching normative walking distance for her age group.    Time 6    Period Weeks                   Plan - 08/17/20 1239     Clinical Impression Statement Pt had better performance of exercise program and able to tolerate increased duration of LE exercise. Pt had decreased claudication with recumbent cycling today and improved shoulder ROM, though had weakness with shoulder ER. Pt shows improvement with compliance and has increased walking duration. Pt would benefit from continued skilled therapy in order to reach goals and maximize functional mobility and R shoulder strength and ROM for full return to PLOF.    Personal Factors and Comorbidities Age;Comorbidity 1;Time since onset of injury/illness/exacerbation;Behavior Pattern;Past/Current Experience    Examination-Activity Limitations Bathing;Lift;Stand;Locomotion Level;Reach Overhead;Carry;Sleep;Squat;Stairs;Dressing    Examination-Participation Restrictions Community Activity;Meal Prep;Yard Work;Shop;Cleaning    Stability/Clinical Decision Making Evolving/Moderate complexity    Rehab Potential Fair    PT Frequency 2x / week    PT Duration 6 weeks  PT Treatment/Interventions ADLs/Self  Care Home Management;Cryotherapy;Electrical Stimulation;Iontophoresis 4mg /ml Dexamethasone;Moist Heat;Traction;Ultrasound;Aquatic Therapy;Gait training;Stair training;Functional mobility training;Therapeutic activities;Therapeutic exercise;Balance training;Neuromuscular re-education;Patient/family education;Orthotic Fit/Training;Manual techniques;Scar mobilization;Passive range of motion;Dry needling;Taping;Joint Manipulations;Spinal Manipulations    PT Next Visit Plan review HEP, wall push up    PT Home Exercise Plan [email or web address]    Consulted and Agree with Plan of Care Patient             Patient will benefit from skilled therapeutic intervention in order to improve the following deficits and impairments:  Abnormal gait, Pain, Improper body mechanics, Hypomobility, Difficulty walking, Decreased balance, Impaired flexibility, Decreased strength, Decreased range of motion, Decreased activity tolerance, Decreased endurance, Postural dysfunction, Increased muscle spasms  Visit Diagnosis: Decreased right shoulder range of motion  Difficulty walking  Muscle weakness (generalized)     Problem List Patient Active Problem List   Diagnosis Date Noted   PAD (peripheral artery disease) (HCC) 07/18/2020   PVC (premature ventricular contraction) 07/13/2019   Hyperlipidemia 07/13/2019   Tobacco abuse 07/05/2019   Essential hypertension 07/05/2019   Anxiety 07/05/2019   Neck pain 07/05/2019   Depression, major, single episode, mild (HCC) 07/05/2019    07/07/2019 PT, DPT 08/17/20 1:07 PM   [facility] [address] [place], [place], [place] Phone: [phone]   Fax:  [phone]  Name: Sue Little MRN: Marge Duncans Date of Birth: October 25, 1940

## 2020-08-21 ENCOUNTER — Ambulatory Visit: Payer: Medicare HMO | Admitting: Physical Therapy

## 2020-08-21 ENCOUNTER — Other Ambulatory Visit: Payer: Self-pay

## 2020-08-21 ENCOUNTER — Encounter: Payer: Self-pay | Admitting: Physical Therapy

## 2020-08-21 DIAGNOSIS — M6281 Muscle weakness (generalized): Secondary | ICD-10-CM

## 2020-08-21 DIAGNOSIS — M25611 Stiffness of right shoulder, not elsewhere classified: Secondary | ICD-10-CM | POA: Diagnosis not present

## 2020-08-21 DIAGNOSIS — R262 Difficulty in walking, not elsewhere classified: Secondary | ICD-10-CM

## 2020-08-21 NOTE — Therapy (Signed)
[facility] [address] [place], [place], [address] Phone: [phone]   Fax:  [phone]  Physical Therapy Treatment  Patient Details  Name: Sue Little MRN: [id number] Date of Birth: 1940/07/15 Referring Provider (PT): Dr. Denyse Amass   Encounter Date: 08/21/2020   PT End of Session - 08/21/20 1223     Visit Number 7    Number of Visits 13    Date for PT Re-Evaluation 09/22/20    Authorization Type [employer]    PT Start Time 1215    PT Stop Time 1250    PT Time Calculation (min) 35 min    Activity Tolerance Patient tolerated treatment well    Behavior During Therapy [facility] for tasks assessed/performed             Past Medical History:  Diagnosis Date   Allergy    Anxiety disorder    Depression    History of chicken pox    Hyperlipidemia    Hypertension    Tobacco abuse    started at age 32 years    Past Surgical History:  Procedure Laterality Date   ABDOMINAL HYSTERECTOMY  1978   CARDIAC CATHETERIZATION     esohagus stretched     TONSILLECTOMY      There were no vitals filed for this visit.   Subjective Assessment - 08/21/20 1220     Subjective Pt states the shoulder is still very good. She states it feels a little stiff after sleeping on it but she is very happy she is able to sleep on it now. She states she walked for a 27 total mins this past weekend but had increased frequency of smoking.    Pertinent History Previous C/S cortisone injection, >10 years ago    How long can you walk comfortably? 5-10 mins, depending on day    Diagnostic tests ABIs performed on June 12, 2020.  Right side was 0.7 left side was 0.9; X-ray L/S Mild degenerative change without acute abnormality.    Patient Stated Goals Pt states she would like to walk further without pain and improve R shoulder for daily activities, dressing, self care.    Currently in Pain? No/denies    Pain Onset More than a month ago    Pain Onset More than a  month ago                               [facility] Adult PT Treatment/Exercise - 08/21/20 0001       Posture/Postural Control   Posture/Postural Control Postural limitations    Postural Limitations Rounded Shoulders;Increased thoracic kyphosis;Decreased lumbar lordosis      Exercises   Exercises Shoulder;Knee/Hip      Knee/Hip Exercises: Aerobic   Recumbent Bike L1 5 min   pain 5/10 cramping     Knee/Hip Exercises: Standing   Other Standing Knee Exercises STS 2x10    Other Standing Knee Exercises calf raises 10x      Shoulder Exercises: Standing   Row 20 reps    Theraband Level (Shoulder Row) Level 4 (Blue)    Other Standing Exercises wall push up 10x    Other Standing Exercises bilat shoulder ER RTB 3x10   at mirror     Shoulder Exercises: Pulleys   Flexion 3 minutes    ABduction 3 minutes      Shoulder Exercises: Stretch   Corner Stretch Limitations 30s 3x  Other Shoulder Stretches IR BHB stretch with strap 30s 3x                    PT Education - 08/21/20 1221     Education Details exercise progression, HEP compliance, walking program, pedometer usage, reinforcement for walking    Person(s) Educated Patient    Methods Explanation;Demonstration;Tactile cues;Verbal cues    Comprehension Verbalized understanding;Returned demonstration;Verbal cues required;Tactile cues required              PT Short Term Goals - 07/27/20 1318       PT SHORT TERM GOAL #1   Title Pt will become independent with HEP in order to demonstrate synthesis of PT education.    Time 2    Period Weeks    Status New      PT SHORT TERM GOAL #2   Title Pt will be able to demonstrate ability to reach BHB and OH without pain in order to demonstrate functional improvement in UE function for self-care and house hold duties.    Time 3    Period Weeks    Status New      PT SHORT TERM GOAL #3   Title Pt will demonstrate at least a 16  pt improvement in Quick DASH  in order to demonstrate a clinically significant change in UE function.    Time 3    Period Weeks    Status New               PT Long Term Goals - 07/27/20 1318       PT LONG TERM GOAL #1   Title Pt will be able to demonstrate ability to walk >20 mins in order to demonstrate functional improvement in LE function for self-care and amublation.    Time 6    Period Weeks    Status New      PT LONG TERM GOAL #2   Title Pt will be able to reach [facility] and grab/reach/hold >10 lbs in order to demonstrate functional improvement in R UE function.    Time 6    Period Weeks    Status New      PT LONG TERM GOAL #3   Title Pt will be able to perform 5XSTS in under 12s in order to demonstrate functional improvement above the cut off score for older adults.    Time 6    Period Weeks    Status New      PT LONG TERM GOAL #4   Title Pt will demonstrate at least 471 m during in order to demonstrate reaching normative walking distance for her age group.    Time 6    Period Weeks                   Plan - 08/21/20 1228     Clinical Impression Statement Pt able to proceed with exercise program and progress shoulder strengthening at today's session. Pt ROM improving but with continued difficulty in IR behind back reach position. Pt with increased cramping into feet  after 5 mins of recument bike compared to last session, at 5/10 pain. Pt with decreased HEP compliance and will require continued reinforcement for independence with exercise at home. Pt would benefit from continued skilled therapy in order to reach goals and maximize functional mobility and R shoulder strength and ROM for full return to PLOF.    Personal Factors and Comorbidities Age;Comorbidity 1;Time since onset of injury/illness/exacerbation;Behavior Pattern;Past/Current  Experience    Examination-Activity Limitations Bathing;Lift;Stand;Locomotion Level;Reach Overhead;Carry;Sleep;Squat;Stairs;Dressing     Examination-Participation Restrictions Community Activity;Meal Prep;Yard Work;Shop;Cleaning    Stability/Clinical Decision Making Evolving/Moderate complexity    Rehab Potential Fair    PT Frequency 2x / week    PT Duration 6 weeks    PT Treatment/Interventions ADLs/Self Care Home Management;Cryotherapy;Electrical Stimulation;Iontophoresis 4mg /ml Dexamethasone;Moist Heat;Traction;Ultrasound;Aquatic Therapy;Gait training;Stair training;Functional mobility training;Therapeutic activities;Therapeutic exercise;Balance training;Neuromuscular re-education;Patient/family education;Orthotic Fit/Training;Manual techniques;Scar mobilization;Passive range of motion;Dry needling;Taping;Joint Manipulations;Spinal Manipulations    PT Next Visit Plan review HEP, shoulder ABD, toe walking    PT Home Exercise Plan [id number]    Consulted and Agree with Plan of Care Patient             Patient will benefit from skilled therapeutic intervention in order to improve the following deficits and impairments:  Abnormal gait, Pain, Improper body mechanics, Hypomobility, Difficulty walking, Decreased balance, Impaired flexibility, Decreased strength, Decreased range of motion, Decreased activity tolerance, Decreased endurance, Postural dysfunction, Increased muscle spasms  Visit Diagnosis: Decreased right shoulder range of motion  Difficulty walking  Muscle weakness (generalized)     Problem List Patient Active Problem List   Diagnosis Date Noted   PAD (peripheral artery disease) (HCC) 07/18/2020   PVC (premature ventricular contraction) 07/13/2019   Hyperlipidemia 07/13/2019   Tobacco abuse 07/05/2019   Essential hypertension 07/05/2019   Anxiety 07/05/2019   Neck pain 07/05/2019   Depression, major, single episode, mild (HCC) 07/05/2019   07/07/2019 PT, DPT 08/21/20 12:55 PM   [facility] [address] [place], [place], [place] Phone: [phone]    Fax:  [phone]  Name: Sue Little MRN: Marge Duncans Date of Birth: [birth year]

## 2020-08-24 ENCOUNTER — Ambulatory Visit: Payer: Medicare HMO | Admitting: Physical Therapy

## 2020-08-24 ENCOUNTER — Other Ambulatory Visit: Payer: Self-pay

## 2020-08-24 ENCOUNTER — Encounter: Payer: Self-pay | Admitting: Physical Therapy

## 2020-08-24 DIAGNOSIS — M25611 Stiffness of right shoulder, not elsewhere classified: Secondary | ICD-10-CM

## 2020-08-24 DIAGNOSIS — M6281 Muscle weakness (generalized): Secondary | ICD-10-CM | POA: Diagnosis not present

## 2020-08-24 DIAGNOSIS — R262 Difficulty in walking, not elsewhere classified: Secondary | ICD-10-CM

## 2020-08-24 NOTE — Therapy (Addendum)
[facility] [address] [place], [place], [address] Phone: [phone]   Fax:  [phone]  Physical Therapy Treatment  PHYSICAL THERAPY DISCHARGE SUMMARY  Visits from Start of Care: 8  Plan: Patient agrees to discharge.  Patient goals were not met. Patient is being discharged due to not returning to therapy.      Patient Details  Name: Sue Little MRN: [id number] Date of Birth: Nov 08, 1940 Referring Provider (PT): Dr. Georgina Snell   Encounter Date: 08/24/2020   PT End of Session - 08/24/20 1215     Visit Number 8    Number of Visits 13    Date for PT Re-Evaluation 09/22/20    Authorization Type [employer]    PT Start Time 1215    PT Stop Time 1300    PT Time Calculation (min) 45 min    Activity Tolerance Patient tolerated treatment well    Behavior During Therapy WFL for tasks assessed/performed             Past Medical History:  Diagnosis Date   Allergy    Anxiety disorder    Depression    History of chicken pox    Hyperlipidemia    Hypertension    Tobacco abuse    started at age 81 years    Past Surgical History:  Procedure Laterality Date   ABDOMINAL HYSTERECTOMY  1978   CARDIAC CATHETERIZATION     esohagus stretched     TONSILLECTOMY      There were no vitals filed for this visit.   Subjective Assessment - 08/24/20 1214     Subjective Pt states the shoulder is still very good. She still has very low compliance with HEP. She is continuing to walk and trying to exercise. She just came from walking at the store and her feet were painful walking into the clinic.    Pertinent History Previous C/S cortisone injection, >10 years ago    How long can you walk comfortably? 5-10 mins, depending on day    Diagnostic tests ABIs performed on June 12, 2020.  Right side was 0.7 left side was 0.9; X-ray L/S Mild degenerative change without acute abnormality.    Patient Stated Goals Pt states she would like to walk  further without pain and improve R shoulder for daily activities, dressing, self care.    Currently in Pain? No/denies    Pain Onset More than a month ago    Pain Onset More than a month ago                               [facility] Adult PT Treatment/Exercise - 08/24/20 0001       Posture/Postural Control   Posture/Postural Control Postural limitations    Postural Limitations Rounded Shoulders;Increased thoracic kyphosis;Decreased lumbar lordosis      Exercises   Exercises Shoulder;Knee/Hip      Knee/Hip Exercises: Aerobic   Recumbent Bike L1 9.5 min   1.0 mile     Knee/Hip Exercises: Standing   Other Standing Knee Exercises STS 4x5   5lbs     Shoulder Exercises: Standing   Horizontal ABduction Limitations RTB 2x10    ABduction 20 reps   2x10   Shoulder ABduction Weight (lbs) 1    Row 20 reps    Theraband Level (Shoulder Row) Level 4 (Blue)    Other Standing Exercises bilat shoulder ER RTB 3x10   at mirror  Shoulder Exercises: Pulleys   Flexion 3 minutes    ABduction 3 minutes      Shoulder Exercises: ROM/Strengthening   UBE (Upper Arm Bike) L1 74fd 3 rev      Shoulder Exercises: Stretch   Corner Stretch Limitations 30s 3x    Other Shoulder Stretches IR BHB stretch with strap 30s 3x                    PT Education - 08/24/20 1221     Education Details exercise progression, HEP compliance, walking program, pedometer usage, reinforcement for walking    Person(s) Educated Patient    Methods Explanation;Demonstration;Tactile cues;Verbal cues    Comprehension Verbalized understanding;Returned demonstration;Verbal cues required;Tactile cues required              PT Short Term Goals - 07/27/20 1318       PT SHORT TERM GOAL #1   Title Pt will become independent with HEP in order to demonstrate synthesis of PT education.    Time 2    Period Weeks    Status New      PT SHORT TERM GOAL #2   Title Pt will be able to demonstrate  ability to reach BHB and OH without pain in order to demonstrate functional improvement in UE function for self-care and house hold duties.    Time 3    Period Weeks    Status New      PT SHORT TERM GOAL #3   Title Pt will demonstrate at least a 16  pt improvement in Quick DASH in order to demonstrate a clinically significant change in UE function.    Time 3    Period Weeks    Status New               PT Long Term Goals - 07/27/20 1318       PT LONG TERM GOAL #1   Title Pt will be able to demonstrate ability to walk >20 mins in order to demonstrate functional improvement in LE function for self-care and amublation.    Time 6    Period Weeks    Status New      PT LONG TERM GOAL #2   Title Pt will be able to reach [facility]/hold >10 lbs in order to demonstrate functional improvement in R UE function.    Time 6    Period Weeks    Status New      PT LONG TERM GOAL #3   Title Pt will be able to perform 5XSTS in under 12s in order to demonstrate functional improvement above the cut off score for older adults.    Time 6    Period Weeks    Status New      PT LONG TERM GOAL #4   Title Pt will demonstrate at least 459m during 6MWT in order to demonstrate reaching normative walking distance for her age group.    Time 6    Period Weeks                   Plan - 08/24/20 1252     Clinical Impression Statement Pt progressing well with R shoulder strengthening and LE exercise but pt has moderate endurance deficits with sustained exercise. UE exercises and LE are alternated due to increased fatigue and cramping. Pt ROM in R shoulder has improved with behind back reaching but has motor control and weakness deficits with OH exercise and scapular  coordination. Plan to trial more CKC exercise at next session. Pt would benefit from continued skilled therapy in order to reach goals, maximize functional mobility, improve endurance, and maximize R shoulder strength and ROM  for full return to PLOF.    Personal Factors and Comorbidities Age;Comorbidity 1;Time since onset of injury/illness/exacerbation;Behavior Pattern;Past/Current Experience    Examination-Activity Limitations Bathing;Lift;Stand;Locomotion Level;Reach Overhead;Carry;Sleep;Squat;Stairs;Dressing    Examination-Participation Restrictions Community Activity;Meal Prep;Yard Work;Shop;Cleaning    Stability/Clinical Decision Making Evolving/Moderate complexity    Rehab Potential Fair    PT Frequency 2x / week    PT Duration 6 weeks    PT Treatment/Interventions ADLs/Self Care Home Management;Cryotherapy;Electrical Stimulation;Iontophoresis 86m/ml Dexamethasone;Moist Heat;Traction;Ultrasound;Aquatic Therapy;Gait training;Stair training;Functional mobility training;Therapeutic activities;Therapeutic exercise;Balance training;Neuromuscular re-education;Patient/family education;Orthotic Fit/Training;Manual techniques;Scar mobilization;Passive range of motion;Dry needling;Taping;Joint Manipulations;Spinal Manipulations    PT Next Visit Plan review HEP, counter push up, review horiztonal ABD and ABD    PT Home Exercise Plan [id number]    Consulted and Agree with Plan of Care Patient             Patient will benefit from skilled therapeutic intervention in order to improve the following deficits and impairments:  Abnormal gait, Pain, Improper body mechanics, Hypomobility, Difficulty walking, Decreased balance, Impaired flexibility, Decreased strength, Decreased range of motion, Decreased activity tolerance, Decreased endurance, Postural dysfunction, Increased muscle spasms  Visit Diagnosis: Decreased right shoulder range of motion  Difficulty walking  Muscle weakness (generalized)     Problem List Patient Active Problem List   Diagnosis Date Noted   PAD ([place]ipheral artery disease) ([place] 07/18/2020   PVC (premature ventricular contraction) 07/13/2019   Hyperlipidemia 07/13/2019   Tobacco abuse  07/05/2019   Essential hypertension 07/05/2019   Anxiety 07/05/2019   Neck pain 07/05/2019   Depression, major, single episode, mild ([place] 07/05/2019   ADaleen BoPT, DPT 08/24/20 1:12 PM   [facility] [address] [address]Phone: [phone]  Fax:  [phone] Name: BRosina DemarinisMRN: [id number] of Birth: 9July 26, 1942

## 2020-08-29 ENCOUNTER — Ambulatory Visit: Payer: Medicare HMO | Admitting: Family Medicine

## 2020-09-06 ENCOUNTER — Ambulatory Visit (INDEPENDENT_AMBULATORY_CARE_PROVIDER_SITE_OTHER): Payer: Medicare HMO | Admitting: Psychologist

## 2020-09-06 DIAGNOSIS — F320 Major depressive disorder, single episode, mild: Secondary | ICD-10-CM | POA: Diagnosis not present

## 2020-09-12 ENCOUNTER — Ambulatory Visit: Payer: Medicare HMO | Admitting: Psychologist

## 2020-09-13 ENCOUNTER — Ambulatory Visit (INDEPENDENT_AMBULATORY_CARE_PROVIDER_SITE_OTHER): Payer: Medicare HMO | Admitting: Psychologist

## 2020-09-13 DIAGNOSIS — F320 Major depressive disorder, single episode, mild: Secondary | ICD-10-CM | POA: Diagnosis not present

## 2020-09-27 ENCOUNTER — Ambulatory Visit (INDEPENDENT_AMBULATORY_CARE_PROVIDER_SITE_OTHER): Payer: Medicare HMO | Admitting: Psychologist

## 2020-09-27 DIAGNOSIS — F320 Major depressive disorder, single episode, mild: Secondary | ICD-10-CM

## 2020-10-05 ENCOUNTER — Ambulatory Visit (INDEPENDENT_AMBULATORY_CARE_PROVIDER_SITE_OTHER): Payer: Medicare HMO | Admitting: Psychologist

## 2020-10-05 DIAGNOSIS — F320 Major depressive disorder, single episode, mild: Secondary | ICD-10-CM

## 2020-10-23 ENCOUNTER — Other Ambulatory Visit: Payer: Self-pay | Admitting: Physician Assistant

## 2020-10-23 ENCOUNTER — Ambulatory Visit (INDEPENDENT_AMBULATORY_CARE_PROVIDER_SITE_OTHER): Payer: Medicare HMO | Admitting: Psychologist

## 2020-10-23 DIAGNOSIS — F320 Major depressive disorder, single episode, mild: Secondary | ICD-10-CM | POA: Diagnosis not present

## 2020-11-03 ENCOUNTER — Ambulatory Visit (INDEPENDENT_AMBULATORY_CARE_PROVIDER_SITE_OTHER): Payer: Medicare HMO | Admitting: Psychologist

## 2020-11-03 DIAGNOSIS — F320 Major depressive disorder, single episode, mild: Secondary | ICD-10-CM | POA: Diagnosis not present

## 2020-11-16 ENCOUNTER — Ambulatory Visit (INDEPENDENT_AMBULATORY_CARE_PROVIDER_SITE_OTHER): Payer: Medicare HMO | Admitting: Psychologist

## 2020-11-16 DIAGNOSIS — F320 Major depressive disorder, single episode, mild: Secondary | ICD-10-CM | POA: Diagnosis not present

## 2020-11-30 ENCOUNTER — Ambulatory Visit (INDEPENDENT_AMBULATORY_CARE_PROVIDER_SITE_OTHER): Payer: Medicare HMO | Admitting: Psychologist

## 2020-11-30 DIAGNOSIS — F320 Major depressive disorder, single episode, mild: Secondary | ICD-10-CM | POA: Diagnosis not present

## 2021-01-03 ENCOUNTER — Other Ambulatory Visit: Payer: Self-pay | Admitting: Physician Assistant

## 2021-01-04 ENCOUNTER — Ambulatory Visit (INDEPENDENT_AMBULATORY_CARE_PROVIDER_SITE_OTHER): Payer: Medicare HMO | Admitting: Psychologist

## 2021-01-04 DIAGNOSIS — F320 Major depressive disorder, single episode, mild: Secondary | ICD-10-CM | POA: Diagnosis not present

## 2021-01-09 ENCOUNTER — Ambulatory Visit (INDEPENDENT_AMBULATORY_CARE_PROVIDER_SITE_OTHER): Payer: Medicare HMO | Admitting: Psychologist

## 2021-01-09 DIAGNOSIS — F320 Major depressive disorder, single episode, mild: Secondary | ICD-10-CM

## 2021-01-18 ENCOUNTER — Other Ambulatory Visit: Payer: Self-pay | Admitting: Physician Assistant

## 2021-01-24 ENCOUNTER — Ambulatory Visit (INDEPENDENT_AMBULATORY_CARE_PROVIDER_SITE_OTHER): Payer: Medicare HMO | Admitting: Psychologist

## 2021-01-24 DIAGNOSIS — F320 Major depressive disorder, single episode, mild: Secondary | ICD-10-CM | POA: Diagnosis not present

## 2021-02-12 ENCOUNTER — Other Ambulatory Visit: Payer: Self-pay | Admitting: Physician Assistant

## 2021-02-12 DIAGNOSIS — N6489 Other specified disorders of breast: Secondary | ICD-10-CM

## 2021-02-21 ENCOUNTER — Ambulatory Visit: Payer: Medicare HMO | Admitting: Psychologist

## 2021-02-22 ENCOUNTER — Ambulatory Visit (INDEPENDENT_AMBULATORY_CARE_PROVIDER_SITE_OTHER): Payer: Medicare HMO | Admitting: Psychologist

## 2021-02-22 DIAGNOSIS — F320 Major depressive disorder, single episode, mild: Secondary | ICD-10-CM

## 2021-02-22 NOTE — Progress Notes (Signed)
[facility] Counselor/Therapist Progress Note  Patient ID: Sue Little, MRN: [id number],    Date: 02/22/2021  Time Spent: 1:00 pm to 1:40 pm; total time: 40 minutes  This session was held via video webex teletherapy due to the coronavirus risk at this time. The patient consented to video teletherapy and was located at her home during this session. She is aware it is the responsibility of the patient to secure confidentiality on her end of the session. The provider was in a private home office for the duration of this session. Limits of confidentiality were discussed with the patient.   Treatment Type:  Individual Therapy  Reported Symptoms: Patient endorsed more anxiety symptoms.   Mental Status Exam: Appearance:  Fairly Groomed     Behavior: Appropriate  Motor: Normal  Speech/Language:  Normal Rate  Affect: Appropriate  Mood: anxious  Thought process: normal  Thought content:   WNL  Sensory/Perceptual disturbances:   WNL  Orientation: oriented to person, place, time/date, situation, and day of week  Attention: Good  Concentration: Good  Memory: WNL  Fund of knowledge:  Good  Insight:   Fair  Judgment:  Fair  Impulse Control: Good   Risk Assessment: Danger to Self:  No Self-injurious Behavior: No Danger to Others: No Duty to Warn:no Physical Aggression / Violence:No  Access to Firearms a concern: No  Gang Involvement:No   Subjective: Patient described herself as experiencing more anxiety due to stressors from her daughter's family. Patient spent the session reflecting on the stressors and how to be supportive of her daughter. She processed thoughts and emotions she was experiencing. Patient was agreeable to following up. She denied suicidal and homicidal ideation.   Interventions: Worked on developing a therapeutic relationship with the patient. Used active listening and reflective statements. Provided emotional support using empathy and validation.  Reflected on events since the last session. Validated expressed concerns. Challenged thoughts that were expressed. Processed expressed thoughts and emotions. Used socratic questions to assist the patient gain insight into self. Used TLDP to assist the patient. Provided psychoeducation about modeled human behavior. Identified themes and processed the emotions with those themes. Explored if any reason for people to change behaviors. Provided empathic statements. Assessed for suicidal and homicidal ideation.  Homework: NA  Next Session: Emotional support  Diagnosis: F32.0 major depressive affective disorder, single episode, mild  Plan: Client Abilities: Patient is friendly and easy to develop rapport.   Client Preferences: Patient voiced interest in ACT and CBT  Client statement of Needs: Patient stated that she needs coping skills and to process emotions related to family dynamics.   Treatment Level: Patient benefits from outpatient therapy done on at least a monthly basis. Depending on the severity of symptoms, possibly bi-weekly therapy.   Symptoms: Patient endorsed experiencing the following: feeling sad, rumination of negative thoughts, low self-esteem, social isolation, avoiding pleasurable activities, easily overwhelmed, feeling restless, and feeling on edge. Multiple stressors. She denied suicidal and homicidal ideation.   Goals:    Alleviate depressive symptoms Recognize, accept, and cope with depressive feelings Develop healthy thinking patterns Develop healthy interpersonal relationships  Objectives Verbalize an accurate understanding of depression (10.27.2023) Verbalize an understanding of the treatment (10.27.2023) Identify and replace thoughts that support depression (10.27.2023) Learn and implement behavioral strategies (10.27.2023) Verbalize an understanding and resolution of current interpersonal problems (10.27.2023) Learn and implement problem solving and decision making  skills (10.27.2023) Learn and implement conflict resolution skills to resolve interpersonal problems (10.27.2023) Verbalize an understanding of healthy and  unhealthy emotions verbalize insight into how past relationships may be influence current experiences with depression (10.27.2023) Use mindfulness and acceptance strategies and increase value based behavior (10.27.2023) Increase hopeful statements about the future. (10.27.2023) Develop and use coping strategies to manage stressors. (10.27.2023)  Interventions  Consistent with treatment model, discuss how change in cognitive, behavioral, and interpersonal can help client alleviate depression CBT Behavioral activation help the client explore the relationship, nature of the dispute,  Help the client develop new interpersonal skills and relationships Conduct Problem so living therapy Teach conflict resolution skills Use a process-experiential approach Conduct TLDP Conduct ACT Teach the patient relaxation skills   Hilbert Corrigan, PsyD

## 2021-03-17 ENCOUNTER — Other Ambulatory Visit: Payer: Self-pay | Admitting: Family Medicine

## 2021-03-21 ENCOUNTER — Other Ambulatory Visit: Payer: Self-pay

## 2021-03-21 ENCOUNTER — Encounter: Payer: Self-pay | Admitting: Physician Assistant

## 2021-03-21 ENCOUNTER — Ambulatory Visit (INDEPENDENT_AMBULATORY_CARE_PROVIDER_SITE_OTHER): Payer: Medicare HMO | Admitting: Physician Assistant

## 2021-03-21 VITALS — BP 110/74 | HR 60 | Temp 97.9°F | Ht 62.0 in | Wt 162.5 lb

## 2021-03-21 DIAGNOSIS — L989 Disorder of the skin and subcutaneous tissue, unspecified: Secondary | ICD-10-CM

## 2021-03-21 DIAGNOSIS — I10 Essential (primary) hypertension: Secondary | ICD-10-CM

## 2021-03-21 DIAGNOSIS — Z720 Tobacco use: Secondary | ICD-10-CM

## 2021-03-21 DIAGNOSIS — F320 Major depressive disorder, single episode, mild: Secondary | ICD-10-CM

## 2021-03-21 DIAGNOSIS — Z5320 Procedure and treatment not carried out because of patient's decision for unspecified reasons: Secondary | ICD-10-CM

## 2021-03-21 DIAGNOSIS — I739 Peripheral vascular disease, unspecified: Secondary | ICD-10-CM

## 2021-03-21 DIAGNOSIS — E785 Hyperlipidemia, unspecified: Secondary | ICD-10-CM

## 2021-03-21 DIAGNOSIS — R079 Chest pain, unspecified: Secondary | ICD-10-CM | POA: Diagnosis not present

## 2021-03-21 DIAGNOSIS — F419 Anxiety disorder, unspecified: Secondary | ICD-10-CM | POA: Diagnosis not present

## 2021-03-21 DIAGNOSIS — R7303 Prediabetes: Secondary | ICD-10-CM

## 2021-03-21 DIAGNOSIS — Z789 Other specified health status: Secondary | ICD-10-CM

## 2021-03-21 DIAGNOSIS — K5900 Constipation, unspecified: Secondary | ICD-10-CM

## 2021-03-21 LAB — CBC WITH DIFFERENTIAL/PLATELET
Basophils Absolute: 0.1 10*3/uL (ref 0.0–0.1)
Basophils Relative: 0.7 % (ref 0.0–3.0)
Eosinophils Absolute: 0.2 10*3/uL (ref 0.0–0.7)
Eosinophils Relative: 2.7 % (ref 0.0–5.0)
HCT: 42.9 % (ref 36.0–46.0)
Hemoglobin: 13.8 g/dL (ref 12.0–15.0)
Lymphocytes Relative: 35.5 % (ref 12.0–46.0)
Lymphs Abs: 3.3 10*3/uL (ref 0.7–4.0)
MCHC: 32.3 g/dL (ref 30.0–36.0)
MCV: 85.4 fl (ref 78.0–100.0)
Monocytes Absolute: 0.6 10*3/uL (ref 0.1–1.0)
Monocytes Relative: 6.8 % (ref 3.0–12.0)
Neutro Abs: 5.1 10*3/uL (ref 1.4–7.7)
Neutrophils Relative %: 54.3 % (ref 43.0–77.0)
Platelets: 232 10*3/uL (ref 150.0–400.0)
RBC: 5.02 Mil/uL (ref 3.87–5.11)
RDW: 13.2 % (ref 11.5–15.5)
WBC: 9.3 10*3/uL (ref 4.0–10.5)

## 2021-03-21 LAB — HEMOGLOBIN A1C: Hgb A1c MFr Bld: 6.6 % — ABNORMAL HIGH (ref 4.6–6.5)

## 2021-03-21 MED ORDER — METOPROLOL SUCCINATE ER 25 MG PO TB24: 25.0000 mg | ORAL_TABLET | Freq: Every day | ORAL | 1 refills | Status: DC

## 2021-03-21 MED ORDER — LORAZEPAM 0.5 MG PO TABS: ORAL_TABLET | ORAL | 2 refills | Status: DC

## 2021-03-21 MED ORDER — CITALOPRAM HYDROBROMIDE 40 MG PO TABS: ORAL_TABLET | ORAL | 1 refills | Status: DC

## 2021-03-21 MED ORDER — BUSPIRONE HCL 15 MG PO TABS: ORAL_TABLET | ORAL | 1 refills | Status: DC

## 2021-03-21 NOTE — Progress Notes (Signed)
Marge DuncansBrenda Duhon is a 81 y.o. female here for a follow up of HTN and Anxiety.   History of Present Illness:   Chief Complaint  Patient presents with   Hypertension   Anxiety   Depression    HPI  HTN Currently compliant with taking toprol XL 25 mg daily with no adverse effects. At home blood pressure readings are: not checked. Mrs. Jennette Kettleeal does admit that she has moments where she experiences lightheadedness or feelings of being unsteady. Despite this she believes her medication is beneficial and does not want to change it. Patient denies chest pain, SOB, blurred vision, dizziness, unusual headaches, lower leg swelling.  Denies excessive caffeine intake, stimulant usage, excessive alcohol intake, or increase in salt consumption.  BP Readings from Last 3 Encounters:  03/21/21 110/74  07/24/20 (!) 160/84  07/18/20 (!) 146/70   Depression/Anxiety Steward DroneBrenda is compliant with taking buspar 15 mg twice daily, celexa 40 mg daily, and ativan 0.5 mg daily as needed with no adverse effects. Steward DroneBrenda states she does regularly follow up with Dr. Bosie ClosSchooler, behavioral health but is not finding these visits to be helpful. Reports that she likes her provider but is considering switching. Currently she states she is feeling a bit of stress due to her strained relationship with her daughter. Denies SI/HI.  PAD Since our prior visit, Steward DroneBrenda has followed up with Dr. Denyse Amassorey, Sports medicine who in turned referred her to Dr. Darrick PennaFields, vascular surgery. States that she did follow up with Dr. Darrick PennaFields who recommended she practice smoking cessation and increase daily walking to help with sx. Due to refusal to stop smoking, Steward DroneBrenda has not returned to this provider. Currently she is not participating in daily walking and is still having bilateral leg cramps. At this time she does find it difficult to walk at times and is not ready to stop smoking. Denies numbness/tingling.   Tobacco Use Pt is still smoking daily and is not  interested in stopping any time soon. She does understand that it would be better for her to stop, but due to family stress she is not able to.   HLD Pt is not on any medication for this issue and is not interested in starting a medication at this time. She is aware of her increased risk of stroke and heart attack, but is still not willing to trial a medication.   Chronic Constipation Pt is still experiencing this issue, usually painful BMs. Pt does have hemorrhoids which causes her to occasionally bleed during these painful and hard BMs. She has not tried anything to manage her sx.   Decreased Independence  Mrs. Jennette Kettleeal has found it hard to do daily tasks such as driving herself places or doing her grocery shopping due to feelings of confusion. She also admits that she finds herself needing to take ativan 0.5 mg prior to going to the store due to feeling overwhelmed. Reports that she tries to ask her daughter for help with these tasks, but has been told by daughter that she is too busy. Currently she is interested in connecting with a Child psychotherapistsocial worker to get some help.   Chest Pain Pt states she is experiencing left sided chest pain that occasionally radiates to her left axilla. Pain has been onset for a couple of days and is reproducible, which leads pt to believe she just slept wrong.  Denies SOB, feelings of tightness, or heart palpitations.   Skin lesions She has multiple skin lesions that she would like evaluated by dermatology;  requesting referral today.  Past Medical History:  Diagnosis Date   Allergy    Anxiety disorder    Depression    History of chicken pox    Hyperlipidemia    Hypertension    Tobacco abuse    started at age 68 years     Social History   Tobacco Use   Smoking status: Every Day    Packs/day: 1.00    Types: Cigarettes    Start date: 03/11/1960   Smokeless tobacco: Never  Vaping Use   Vaping Use: Never used  Substance Use Topics   Alcohol use: Never   Drug  use: Never    Past Surgical History:  Procedure Laterality Date   ABDOMINAL HYSTERECTOMY  1978   CARDIAC CATHETERIZATION     esohagus stretched     TONSILLECTOMY      Family History  Problem Relation Age of Onset   Diabetes Mother    Heart disease Mother    Coronary artery disease Mother    Dementia Mother    Alcohol abuse Father    Heart attack Father    Depression Sister    Lung cancer Maternal Grandfather    Osteoarthritis Sister    Depression Sister    Prostate cancer Brother    Prostate cancer Brother     Allergies  Allergen Reactions   Procaine Other (See Comments)    Hypotension, faint  Can tolerate mepivacaine   Ivp Dye [Iodinated Contrast Media] Hives    swelling   Sulfa Antibiotics Hives    Current Medications:   Current Outpatient Medications:    aspirin EC 81 MG tablet, Take 81 mg by mouth daily. Swallow whole., Disp: , Rfl:    busPIRone (BUSPAR) 15 MG tablet, TAKE 1 TABLET TWICE DAILY (NEED APPOINTMENT IN SEPTEMBER), Disp: 180 tablet, Rfl: 0   citalopram (CELEXA) 40 MG tablet, TAKE 1 TABLET EVERY DAY (NEED APPOINTMENT IN SEPTEMBER), Disp: 90 tablet, Rfl: 0   LORazepam (ATIVAN) 0.5 MG tablet, PT TO TAKE 1/4 TABLET TO 1 TABLET DAILY AS NEEDED., Disp: 30 tablet, Rfl: 0   metoprolol succinate (TOPROL-XL) 25 MG 24 hr tablet, TAKE 1 TABLET EVERY DAY, Disp: 90 tablet, Rfl: 1   nystatin ointment (MYCOSTATIN), Apply to affected area daily, Disp: 30 g, Rfl: 0   ipratropium (ATROVENT) 0.03 % nasal spray, Place 2 sprays into both nostrils every 12 (twelve) hours. (Patient not taking: Reported on 05/31/2020), Disp: 30 mL, Rfl: 12   mineral oil liquid, Take by mouth at bedtime. Pt takes one tablespoon (Patient not taking: Reported on 05/31/2020), Disp: , Rfl:    Omega-3 Fatty Acids (FISH OIL PO), Take 5 mLs by mouth daily. (Patient not taking: Reported on 05/31/2020), Disp: , Rfl:    Review of Systems:   ROS Negative unless otherwise specified per HPI. Vitals:    Vitals:   03/21/21 1328  BP: 110/74  Pulse: 60  Temp: 97.9 F (36.6 C)  TempSrc: Temporal  SpO2: 97%  Weight: 162 lb 8 oz (73.7 kg)  Height: 5\' 2"  (1.575 m)     Body mass index is 29.72 kg/m.  Physical Exam:   Physical Exam Vitals and nursing note reviewed.  Constitutional:      General: She is not in acute distress.    Appearance: She is well-developed. She is not ill-appearing or toxic-appearing.  Cardiovascular:     Rate and Rhythm: Normal rate and regular rhythm.     Pulses: Normal pulses.     Heart  sounds: Normal heart sounds, S1 normal and S2 normal.  Pulmonary:     Effort: Pulmonary effort is normal.     Breath sounds: Normal breath sounds.  Skin:    General: Skin is warm and dry.  Neurological:     Mental Status: She is alert.     GCS: GCS eye subscore is 4. GCS verbal subscore is 5. GCS motor subscore is 6.  Psychiatric:        Speech: Speech normal.        Behavior: Behavior normal. Behavior is cooperative.    Assessment and Plan:   Essential hypertension Controlled  Continue Toprol XL 25 mg daily Monitor BP at home regularly  I advised patient that if BP readings are consistently >150/90 to reach out to office and medication will be adjusted Follow up in 6 months, sooner if concerns occur  Tobacco abuse Patient verbalized understanding that continued tobacco use can increase risk of heart attack or stroke; Patient is not interested in smoking cessation  Depression, major, single episode, mild (HCC); Anxiety Uncontrolled, no red flags on discussion Patient denies SI/HI Continue celexa 40 mg daily  Continue buspar 15 mg twice daily and ativan 0.5 mg as needed Provided patient with handout of psychology resources  I advised patient that if they develop any SI, to tell someone immediately and seek medical attention Follow up in 1 month (virtually if needed), sooner if concerns occur  Hyperlipidemia, unspecified hyperlipidemia type Update labs  today, make recommendations accordingly   - Lipid Panel Declines medication intervention at this time   Refusal of statin medication by patient Patient verbalized understanding that risk of heart attack and stroke is increased without a statin medication   Prediabetes Update labs today, will make recommendations accordingly   - Hemoglobin A1c  Chest pain, unspecified type EKG tracing is personally reviewed.  EKG notes NSR.  No acute changes.  She declines referral to cardiology at this time to further evaluate Advised patient that if worsening of chest pain or shortness of breath occurs, to visit ER immediately  Continue to monitor  Decreased independence with activities of daily living Referral for Social worker has been placed for additional social support options  Provided patient with Advanced Directives packet   Chronic Constipation Ongoing Start bowel regimen-- provided patient with instructed bowel regimen of Colace and Miralax Follow up as needed Refuses colonoscopy  Skin lesion Will refer to dermatology for further evaluation  I,Havlyn C Ratchford,acting as a scribe for [employer], PA.,have documented all relevant documentation on the behalf of Jarold Motto, PA,as directed by  Jarold Motto, PA while in the presence of Jarold Motto, [place].  I, Jarold Motto, [place], have reviewed all documentation for this visit. The documentation on 03/21/21 for the exam, diagnosis, procedures, and orders are all accurate and complete.  Time spent with patient today was 62 minutes which consisted of chart review, discussing diagnosis, work up, treatment answering questions and documentation.  Jarold Motto, PA-C

## 2021-03-21 NOTE — Patient Instructions (Signed)
It was great to see you!  You will be contacted by the social worker to help you figure out some good social support options for you  We will continue your medications as is for now  Look at handouts for psychology resources -- call some of these!  Advanced Directives packet -- please review and complete with your family  Please start a bowel regimen (colace and miralax! See handout)  Follow-up with me in 1 month, sooner if concerns  If you develop worsening chest pain or shortness of breath, please go to the ER.  Take care,  Jarold Motto PA-C

## 2021-03-22 LAB — COMPREHENSIVE METABOLIC PANEL
ALT: 18 U/L (ref 0–35)
AST: 18 U/L (ref 0–37)
Albumin: 4.2 g/dL (ref 3.5–5.2)
Alkaline Phosphatase: 63 U/L (ref 39–117)
BUN: 8 mg/dL (ref 6–23)
CO2: 29 mEq/L (ref 19–32)
Calcium: 9.8 mg/dL (ref 8.4–10.5)
Chloride: 104 mEq/L (ref 96–112)
Creatinine, Ser: 0.92 mg/dL (ref 0.40–1.20)
GFR: 59.28 mL/min — ABNORMAL LOW (ref >60.00)
Glucose, Bld: 86 mg/dL (ref 70–99)
Potassium: 4.1 mEq/L (ref 3.5–5.1)
Sodium: 140 mEq/L (ref 135–145)
Total Bilirubin: 0.4 mg/dL (ref 0.2–1.2)
Total Protein: 6.9 g/dL (ref 6.0–8.3)

## 2021-03-22 LAB — LDL CHOLESTEROL, DIRECT: Direct LDL: 127 mg/dL

## 2021-03-22 LAB — LIPID PANEL
Cholesterol: 209 mg/dL — ABNORMAL HIGH (ref 0–200)
HDL: 36.9 mg/dL — ABNORMAL LOW (ref >39.00)
NonHDL: 171.75
Total CHOL/HDL Ratio: 6
Triglycerides: 355 mg/dL — ABNORMAL HIGH (ref 0.0–149.0)
VLDL: 71 mg/dL — ABNORMAL HIGH (ref 0.0–40.0)

## 2021-03-28 ENCOUNTER — Telehealth: Payer: Self-pay

## 2021-03-28 ENCOUNTER — Ambulatory Visit (INDEPENDENT_AMBULATORY_CARE_PROVIDER_SITE_OTHER): Payer: Medicare HMO | Admitting: Psychologist

## 2021-03-28 DIAGNOSIS — F320 Major depressive disorder, single episode, mild: Secondary | ICD-10-CM | POA: Diagnosis not present

## 2021-03-28 NOTE — Chronic Care Management (AMB) (Signed)
Chronic Care Management   Note  03/28/2021 Name: Sue Little MRN: [id number] DOB: June 29, 1940  Sue Little is a 81 y.o. year old female who is a primary care patient of Inda Coke, [place]. I reached out to Karis Juba by phone today in response to a referral sent by [name] PCP.  [name] was given information about Chronic Care Management services today including:  CCM service includes personalized support from designated clinical staff supervised by her physician, including individualized plan of care and coordination with other care providers 24/7 contact phone numbers for assistance for urgent and routine care needs. Service will only be billed when office clinical staff spend 20 minutes or more in a month to coordinate care. Only one practitioner may furnish and bill the service in a calendar month. The patient may stop CCM services at any time (effective at the end of the month) by phone call to the office staff. The patient is responsible for co-pay (up to 20% after annual deductible is met) if co-pay is required by the individual health plan.   Patient agreed to services and verbal consent obtained.   Follow up plan: Telephone appointment with care management team member scheduled for:03/29/2021  Noreene Larsson, [place], [place], [place] [address] Direct Dial: [phone] [email or web address] Website: [email or web address]

## 2021-03-28 NOTE — Progress Notes (Signed)
[facility] Counselor/Therapist Progress Note  Patient ID: Sue Little, MRN: [id number],    Date: 03/28/2021  Time Spent: 11:02 am to 11:41 am; total time: 39 minutes  This session was held via video webex teletherapy due to the coronavirus risk at this time. The patient consented to video teletherapy and was located at her home during this session. She is aware it is the responsibility of the patient to secure confidentiality on her end of the session. The provider was in a private home office for the duration of this session. Limits of confidentiality were discussed with the patient.   Treatment Type:  Individual Therapy  Reported Symptoms: Patient endorsed more anxiety symptoms.   Mental Status Exam: Appearance:  Fairly Groomed     Behavior: Appropriate  Motor: Normal  Speech/Language:  Normal Rate  Affect: Appropriate  Mood: anxious  Thought process: normal  Thought content:   WNL  Sensory/Perceptual disturbances:   WNL  Orientation: oriented to person, place, time/date, situation, and day of week  Attention: Good  Concentration: Good  Memory: WNL  Fund of knowledge:  Good  Insight:   Fair  Judgment:  Fair  Impulse Control: Good   Risk Assessment: Danger to Self:  No Self-injurious Behavior: No Danger to Others: No Duty to Warn:no Physical Aggression / Violence:No  Access to Firearms a concern: No  Gang Involvement:No   Subjective: Patient described herself as having stressors as she feels as though she is not able to get basic needs met. Elaborating, patient feels as though she is losing ability to function. From there, patient spent the session reflecting on different living options including staying where she is at, living with family, and looking at long term care options such as assisted living and nursing homes. She processed thoughts and emotions related to these different options. Patient was agreeable to following up. She denied suicidal and  homicidal ideation.   Interventions: Worked on developing a therapeutic relationship with the patient. Used active listening and reflective statements. Provided emotional support using empathy and validation. Used summary statements. Processed events since the last session. Validated expressed concerns. Provided psychoeducation about in home health aide. Challenged patient's perceptions. Used socratic questions to assist the patient gain insight into self. Explored different living options for the patient. Used MI to roll with the resistance. Challenged patient's thought patterns. Normalized expressed concerns. Assisted in problem solving. Identified different themes including loss of independence. Normalized concerns. Provided empathic statements. Assessed for suicidal and homicidal ideation.  Homework: Reflect on housing options  Next Session: Emotional support  Diagnosis: F32.0 major depressive affective disorder, single episode, mild  Plan:   Client Abilities: Patient is friendly and easy to develop rapport.   Client Preferences: Patient voiced interest in ACT and CBT  Client statement of Needs: Patient stated that she needs coping skills and to process emotions related to family dynamics.   Treatment Level: Patient benefits from outpatient therapy done on at least a monthly basis. Depending on the severity of symptoms, possibly bi-weekly therapy.    Goals:    Alleviate depressive symptoms Recognize, accept, and cope with depressive feelings Develop healthy thinking patterns Develop healthy interpersonal relationships  Objectives target date for all objectives is 01/04/2022.  Verbalize an accurate understanding of depression (10.27.2023) Verbalize an understanding of the treatment (10.27.2023) Identify and replace thoughts that support depression (10.27.2023) Learn and implement behavioral strategies (10.27.2023) Verbalize an understanding and resolution of current interpersonal  problems (10.27.2023) Learn and implement problem solving and decision  making skills (10.27.2023) Learn and implement conflict resolution skills to resolve interpersonal problems (10.27.2023) Verbalize an understanding of healthy and unhealthy emotions verbalize insight into how past relationships may be influence current experiences with depression (10.27.2023) Use mindfulness and acceptance strategies and increase value based behavior (10.27.2023) Increase hopeful statements about the future. (10.27.2023) Develop and use coping strategies to manage stressors. (10.27.2023)  Interventions  Consistent with treatment model, discuss how change in cognitive, behavioral, and interpersonal can help client alleviate depression CBT Behavioral activation help the client explore the relationship, nature of the dispute,  Help the client develop new interpersonal skills and relationships Conduct Problem so living therapy Teach conflict resolution skills Use a process-experiential approach Conduct TLDP Conduct ACT Teach the patient relaxation skills  The patient and clinician reviewed the treatment plan on 02/22/2021. The patient approved of the treatment plan.    Conception Chancy, PsyD                  Conception Chancy, PsyD

## 2021-03-29 ENCOUNTER — Ambulatory Visit (INDEPENDENT_AMBULATORY_CARE_PROVIDER_SITE_OTHER): Payer: Medicare HMO | Admitting: Licensed Clinical Social Worker

## 2021-03-29 DIAGNOSIS — F320 Major depressive disorder, single episode, mild: Secondary | ICD-10-CM

## 2021-03-29 DIAGNOSIS — F419 Anxiety disorder, unspecified: Secondary | ICD-10-CM

## 2021-03-29 DIAGNOSIS — I10 Essential (primary) hypertension: Secondary | ICD-10-CM

## 2021-03-29 NOTE — Chronic Care Management (AMB) (Signed)
Chronic Care Management   Clinical Social Work Note  03/29/2021 Name: Sue Little MRN: [id number] DOB: Dec 07, 1940  Sue Little is a 81 y.o. year old female who is a primary care patient of Inda Coke, [place]. The CCM team was consulted to assist the patient with chronic disease management and/or care coordination needs related to: Level of Care Concerns.   Engaged with patient by telephone for initial visit in response to provider referral for social work chronic care management and care coordination services.   Consent to Services:  The patient was given the following information about Chronic Care Management services today, agreed to services, and gave verbal consent: 1. CCM service includes personalized support from designated clinical staff supervised by the primary care provider, including individualized plan of care and coordination with other care providers 2. 24/7 contact phone numbers for assistance for urgent and routine care needs. 3. Service will only be billed when office clinical staff spend 20 minutes or more in a month to coordinate care. 4. Only one practitioner may furnish and bill the service in a calendar month. 5.The patient may stop CCM services at any time (effective at the end of the month) by phone call to the office staff. 6. The patient will be responsible for cost sharing (co-pay) of up to 20% of the service fee (after annual deductible is met). Patient agreed to services and consent obtained.  Patient agreed to services and consent obtained.   Summary: Assessed patient's previous and current treatment, coping skills, support system and barriers to care. She is experiencing difficulty with meeting needs related to shopping and going places.. Has limited support from family due to working. She reports being able to meet basic ADL's and often afraid to drive and take a bath. Main focus during this encounter was long term care options and care. There are no community support  options at this time, only options are private pay or if patient has long-term care insurance. Patient also expressed wanting to move forward with PT as per patient, this was recommend by Dr. Morene Rankins.See Care Plan below for interventions and patient self-care actives.  Recommendation: Patient may benefit from, and is in agreement to explore all options discussed during this encounter, work on advance directive and allow LCSW to make referral to [employer].   Follow up Plan: Patient would like continued follow-up from CCM LCSW.  per patient's request will follow up in 2 weeks.  Will call office if needed prior to next encounter.   Assessment: Review of patient past medical history, allergies, medications, and health status, including review of relevant consultants reports was performed today as part of a comprehensive evaluation and provision of chronic care management and care coordination services.     SDOH (Social Determinants of Health) assessments and interventions performed:  SDOH Interventions    Flowsheet Row Most Recent Value  SDOH Interventions   SDOH Interventions for the Following Domains Depression, Social Connections  Physical Activity Interventions Other (Comments)  [discussed options/ pt will talk with PCP]  Social Connections Interventions [id number] Referral  Transportation Interventions Other (Comment)  [via insurane benefits]  Depression Interventions/Treatment  Currently on Treatment        Advanced Directives Status: See Care Plan for related entries.  CCM Care Plan   Conditions to be addressed/monitored: HTN, Anxiety, and Depression; Level of care concerns  Care Plan : LCSW Plan of Care  Updates made by Maurine Cane, LCSW since 03/29/2021 12:00 AM     Problem:  Long-Term Care Planning      Goal: Effective Long-Term Care Planning by exploring community options   Start Date: 03/29/2021  This Visit's Progress: On track  Priority: High  Note:   Current  Barriers:  Level of Care Concerns:limited support and difficulty getting help from family No Advanced Directives in place Lacks knowledge of how to connect   [place]):  Patient  will work with Education officer, museum to address concerns related to advance care planning will follow up with resources discussed to day as discussed with Education officer, museum during this encounter through collaboration with [facility] representative, provider, and care team.   Interventions: 1:1 collaboration with primary care provider regarding development and update of comprehensive plan of care as evidenced by provider attestation and co-signature Inter-disciplinary care team collaboration (see longitudinal plan of care) Evaluation of current treatment plan related to  self management and patient's adherence to plan as established by provider Review resources, discussed options and provided patient information about  Transportation provided by insurance provider Enhanced Benefits connected with insurance provider  Referral for [employer] [facility] (community support) Hospital doctor provided by [employer] (has paper work) Herbalist as an options  Level of Care Concerns in a patient with HTN, Anxiety, and Depression:  (Status: New goal.) Current level of care: home, alone Evaluation of patient safety in current living environment Motivational Interviewing employed Solution-Focused Strategies employed:  Active listening / Reflection utilized  Problem Solving [place] understanding of Advance Directives.  and EMMI educational information on Advance Directives sent via e-mail   Patient Self-Care Activities: Call your insurance provider to discuss transportation options Call your insurance provider for more information about your Enhanced Benefits  Continue to look for your insurance policy to see if you have  [place] I have place a referral with [facility] team to discuss Senior Resources community  resource options, they will contact you. Review EMMI educational information on Advance Directive. Look for an e-mail from [facility]. Complete Advance Directive packet,  If you don't complete we will review during our next call      Casimer Lanius, [place] Licensed Clinical Social Worker [facility]  [facility] [place] [phone]

## 2021-03-29 NOTE — Patient Instructions (Signed)
Visit Information   Thank you for taking time to visit with me today. Please don't hesitate to contact me if I can be of assistance to you before our next scheduled telephone appointment.  Following are the goals we discussed today: Long-Term Care Planning  Our next appointment is by telephone on Feb. 2nd at 11:30  Please call the care guide team at [phone] if you need to cancel or reschedule your appointment.   If you are experiencing a Mental Health or [place] or need someone to talk to, please call the [place] National Suicide Prevention Lifeline: (854) 027-5213 or TTY: 249 424 5015 TTY 443-605-4572) to talk to a trained counselor call 1-800-273-TALK (toll free, 24 hour hotline) go to [facility] [address] [place] [phone]) call 911   Following is a copy of your full care plan:  Care Plan : LCSW Plan of Care  Updates made by Maurine Cane, LCSW since 03/29/2021 12:00 AM     Problem: Long-Term Care Planning      Goal: Effective Long-Term Care Planning by exploring community options   Start Date: 03/29/2021  This Visit's Progress: On track  Priority: High  Note:   Current Barriers:  Level of Care Concerns:limited support and difficulty getting help from family No Advanced Directives in place Lacks knowledge of how to connect   [place]):  Patient  will work with Education officer, museum to address concerns related to advance care planning will follow up with resources discussed to day as discussed with Education officer, museum during this encounter through collaboration with [place] representative, provider, and care team.   Interventions: 1:1 collaboration with primary care provider regarding development and update of comprehensive plan of care as evidenced by provider attestation and co-signature Inter-disciplinary care team collaboration (see longitudinal plan of care) Evaluation of current treatment plan related  to  self management and patient's adherence to plan as established by provider Review resources, discussed options and provided patient information about  Transportation provided by insurance provider Enhanced Benefits connected with insurance provider  Referral for [employer] [facility] (community support) Hospital doctor provided by [employer] (has paper work) Herbalist as an options  Level of Care Concerns in a patient with HTN, Anxiety, and Depression:  (Status: New goal.) Current level of care: home, alone Evaluation of patient safety in current living environment Motivational Interviewing employed Solution-Focused Strategies employed:  Active listening / Reflection utilized  Problem Solving [place] understanding of Advance Directives.  and EMMI educational information on Advance Directives sent via e-mail   Patient Self-Care Activities: Call your insurance provider to discuss transportation options Call your insurance provider for more information about your Enhanced Benefits  Continue to look for your insurance policy to see if you have [place] I have place a referral with [place] team to discuss Senior Resources community  resource options, they will contact you. Review EMMI educational information on Advance Directive. Look for an e-mail from [facility]. Complete Advance Directive packet,  If you don't complete we will review during our next call    Consent to CCM Services: Ms. Muzyka was given information about Chronic Care Management services including:  CCM service includes personalized support from designated clinical staff supervised by her physician, including individualized plan of care and coordination with other care providers 24/7 contact phone numbers for assistance for urgent and routine care needs. Service  will only be  billed when office clinical staff spend 20 minutes or more in a month to coordinate care. Only one practitioner may furnish and bill the service in a calendar month. The patient may stop CCM services at any time (effective at the end of the month) by phone call to the office staff. The patient will be responsible for cost sharing (co-pay) of up to 20% of the service fee (after annual deductible is met).  Patient agreed to services and verbal consent obtained.  Patient verbalizes understanding of instructions and care plan provided today and agrees to view in [place]. Active MyChart status confirmed with patient.    Casimer Lanius, LCSW Licensed Clinical Social Worker [facility] Ar[facility] Management  [facility] [place] [phone]

## 2021-04-10 DIAGNOSIS — F320 Major depressive disorder, single episode, mild: Secondary | ICD-10-CM

## 2021-04-10 DIAGNOSIS — I10 Essential (primary) hypertension: Secondary | ICD-10-CM

## 2021-04-12 ENCOUNTER — Ambulatory Visit (INDEPENDENT_AMBULATORY_CARE_PROVIDER_SITE_OTHER): Payer: Medicare HMO | Admitting: Licensed Clinical Social Worker

## 2021-04-12 DIAGNOSIS — F320 Major depressive disorder, single episode, mild: Secondary | ICD-10-CM

## 2021-04-12 DIAGNOSIS — F419 Anxiety disorder, unspecified: Secondary | ICD-10-CM

## 2021-04-12 DIAGNOSIS — Z7189 Other specified counseling: Secondary | ICD-10-CM

## 2021-04-12 NOTE — Patient Instructions (Addendum)
Visit Information  Thank you for taking time to visit with me today. Please don't hesitate to contact me if I can be of assistance to you before our next scheduled telephone appointment.  Following are the goals we discussed today: Long-Term Care planning Patient Self-Care Activities: Continue with therapy Continue to look for your insurance policy to see if you have Long Term Care Insurance I have place a referral with [id number] team to discuss Senior Resources community  resource options, they will contact you. Call your insurance provider to schedule transportation to your medical appointments  Review EMMI educational information on Advance Directive. Look for an e-mail from [facility]. Complete Advance Directive packet,  If you don't complete we will review during our next call Call [facility] [phone] to discuss assisted and independent living options   Our next appointment is by tele-visit on Feb 23rd at 11:30  Please call the care guide team at [phone] if you need to cancel or reschedule your appointment.   If you are experiencing a Mental Health or Behavioral Health Crisis or need someone to talk to, please call 1-800-273-TALK (toll free, 24 hour hotline)   Patient verbalizes understanding of instructions and care plan provided today and agrees to view in MyChart. Active MyChart status confirmed with patient.    Sammuel Hines, LCSW Licensed Clinical Social Worker Lavinia Sharps Management  [facility] [place] [phone]

## 2021-04-12 NOTE — Chronic Care Management (AMB) (Signed)
Chronic Care Management   Clinical Social Work Note  04/12/2021 Name: Sue Little MRN: [id number] DOB: 1940-05-09  Sue Little is a 81 y.o. year old female who is a primary care patient of Inda Coke, [place]. The CCM team was consulted to assist the patient with chronic disease management and/or care coordination needs related to: Level of Care Concerns and Advanced Directive Education.   Engaged with patient by telephone for follow up visit in response to provider referral for social work chronic care management and care coordination services.   Consent to Services:  The patient was given information about Chronic Care Management services, agreed to services, and gave verbal consent prior to initiation of services.  Please see initial visit note for detailed documentation.  Patient agreed to services and consent obtained.   Summary: Assessed patient's current treatment, progress, coping skills, support system and barriers to care.  She continues seeing her therapist Dr. Michail Sermon and is making progress with care plan goals we working on, has transportation set up with insurance provider, willing to discuss PT options with PCP and is open to continue to explore options discussed assisted living for long-term care planning .  See Care Plan below for interventions and patient self-care actives.  Recommendation: Patient may benefit from, and is in agreement to review Advance directive video as well as review assisted living list.   Follow up Plan: Patient would like continued follow-up from CCM LCSW.  per patient's request will follow up in 3 weeks.  Will call office if needed prior to next encounter.   Assessment: Review of patient past medical history, allergies, medications, and health status, including review of relevant consultants reports was performed today as part of a comprehensive evaluation and provision of chronic care management and care coordination services.     SDOH (Social  Determinants of Health) assessments and interventions performed:  SDOH Interventions    Flowsheet Row Most Recent Value  SDOH Interventions   Social Connections Interventions [id number] Referral        Advanced Directives Status: See Care Plan for related entries.  CCM Care Plan  Conditions to be addressed/monitored: Anxiety and Depression; Level of care concerns  Care Plan : LCSW Plan of Care  Updates made by Maurine Cane, LCSW since 04/12/2021 12:00 AM     Problem: Long-Term Care Planning      Goal: Effective Long-Term Care Planning by exploring community options   Start Date: 03/29/2021  This Visit's Progress: On track  Recent Progress: On track  Priority: High  Note:   Current Barriers:  Level of Care Concerns:limited support and difficulty getting help from family No Advanced Directives in place Lacks knowledge of how to connect   [place]):  Patient  will work with Education officer, museum to address concerns related to advance care planning will follow up with resources discussed to day as discussed with Education officer, museum during this encounter through collaboration with [place] representative, provider, and care team.   Interventions: 1:1 collaboration with primary care provider regarding development and update of comprehensive plan of care as evidenced by provider attestation and co-signature Inter-disciplinary care team collaboration (see longitudinal plan of care) Evaluation of current treatment plan related to  self management and patient's adherence to plan as established by provider Review resources, discussed options and provided patient information about  Transportation provided by insurance provider Enhanced Benefits connected with insurance provider  Referral for [employer] (community support) Hospital doctor provided by [employer] (  has paper work) Herbalist as an options  Level of Care Concerns in a patient  with HTN, Anxiety, and Depression:  (Status: Goal on Track (progressing): YES.) Current level of care: home, alone Evaluation of patient safety in current living environment Motivational Interviewing employed Solution-Focused Strategies employed:  Active listening / Reflection utilized  Problem Solving [place] understanding of Advance Directives.  and EMMI educational information on Advance Directives sent via e-mail   Patient Self-Care Activities: Continue with therapy Continue to look for your insurance policy to see if you have [place] I have place a referral with [place] team to discuss Senior Resources community  resource options, they will contact you. Call your insurance provider to schedule transportation to your medical appointments  Review EMMI educational information on Advance Directive. Look for an e-mail from [facility]. Complete Advance Directive packet,  If you don't complete we will review during our next call Call [facility] [phone] to discuss assisted and independent living options       Casimer Lanius, [place] Licensed Clinical Social Worker [facility] Management  [facility] [place] [phone]

## 2021-04-18 ENCOUNTER — Ambulatory Visit: Payer: Medicare HMO | Admitting: Psychologist

## 2021-04-23 ENCOUNTER — Other Ambulatory Visit: Payer: Self-pay | Admitting: Physician Assistant

## 2021-04-23 ENCOUNTER — Ambulatory Visit
Admission: RE | Admit: 2021-04-23 | Discharge: 2021-04-23 | Disposition: A | Discharge status: Home / Self Care | Payer: Medicare HMO | Source: Ambulatory Visit | Attending: Physician Assistant | Admitting: Physician Assistant

## 2021-04-23 DIAGNOSIS — N6489 Other specified disorders of breast: Secondary | ICD-10-CM

## 2021-05-01 ENCOUNTER — Ambulatory Visit (INDEPENDENT_AMBULATORY_CARE_PROVIDER_SITE_OTHER): Payer: Medicare HMO | Admitting: Physician Assistant

## 2021-05-01 ENCOUNTER — Other Ambulatory Visit: Payer: Self-pay

## 2021-05-01 ENCOUNTER — Encounter: Payer: Self-pay | Admitting: Physician Assistant

## 2021-05-01 VITALS — BP 122/70 | HR 74 | Temp 98.5°F | Ht 62.0 in | Wt 164.0 lb

## 2021-05-01 DIAGNOSIS — F320 Major depressive disorder, single episode, mild: Secondary | ICD-10-CM

## 2021-05-01 DIAGNOSIS — I739 Peripheral vascular disease, unspecified: Secondary | ICD-10-CM

## 2021-05-01 DIAGNOSIS — F419 Anxiety disorder, unspecified: Secondary | ICD-10-CM

## 2021-05-01 DIAGNOSIS — R7303 Prediabetes: Secondary | ICD-10-CM | POA: Diagnosis not present

## 2021-05-01 DIAGNOSIS — Z5320 Procedure and treatment not carried out because of patient's decision for unspecified reasons: Secondary | ICD-10-CM

## 2021-05-01 DIAGNOSIS — E785 Hyperlipidemia, unspecified: Secondary | ICD-10-CM

## 2021-05-01 NOTE — Progress Notes (Signed)
Sue Little is a 81 y.o. female here for a follow up of Depression and Anxiety.  History of Present Illness:   Chief Complaint  Patient presents with   Anxiety   Depression    HPI  Anxiety/Depression Sue Little is currently compliant with taking Celexa 40 mg, Buspar 15 mg twice daily, and ativan 0.5 mg as needed with no adverse effects. Sue Little states that she feels her mood is doing much better and believes previous decreased mood was caused by winter months. Additionally during our previous visit, she disclosed that she was looking to switch behavioral health providers, but at this time she is still visiting Dr. Bosie Clos with no complications. Denies SI/HI.   PAD Currently Sue Little has been entertaining the idea of revisiting Dr. Lisbeth Renshaw office, vascular surgery, about the state of her legs. States she is still experiencing bilateral leg cramps and although she has learned to deal with it, she would like to improve this if possible. Denies concerning sx.   Pre-Diabetes Following our previous visit on 03/21/21, Sue Little was found to have a HgbA1c of 6.6. Currently she expresses she does not want to become a diabetic and is interested in learning what she could do to improve this without medication. Reports she does not eat often, but does admit that she is not eating the best options. She does not have much of an appetite and when she does eat, she typically gravitates towards prepared/snack foods.  HLD Although Sue Little previously stated that she was not interested in medication for this issue, she has now admitted to slightly changing her mind. States that in the past she had taken daily fish oil supplements which she found lowered her levels. Due to this, she would like to restart this but is open to trialing a medication if this method does not improve levels. Denies CP or SOB.   Past Medical History:  Diagnosis Date   Allergy    Anxiety disorder    Depression    History of chicken pox     Hyperlipidemia    Hypertension    Tobacco abuse    started at age 51 years     Social History   Tobacco Use   Smoking status: Every Day    Packs/day: 1.00    Types: Cigarettes    Start date: 03/11/1960   Smokeless tobacco: Never  Vaping Use   Vaping Use: Never used  Substance Use Topics   Alcohol use: Never   Drug use: Never    Past Surgical History:  Procedure Laterality Date   ABDOMINAL HYSTERECTOMY  1978   CARDIAC CATHETERIZATION     esohagus stretched     TONSILLECTOMY      Family History  Problem Relation Age of Onset   Diabetes Mother    Heart disease Mother    Coronary artery disease Mother    Dementia Mother    Alcohol abuse Father    Heart attack Father    Depression Sister    Lung cancer Maternal Grandfather    Osteoarthritis Sister    Depression Sister    Prostate cancer Brother    Prostate cancer Brother     Allergies  Allergen Reactions   Procaine Other (See Comments)    Hypotension, faint  Can tolerate mepivacaine   Ivp Dye [Iodinated Contrast Media] Hives    swelling   Sulfa Antibiotics Hives    Current Medications:   Current Outpatient Medications:    aspirin EC 81 MG tablet, Take 81 mg  by mouth daily. Swallow whole., Disp: , Rfl:    busPIRone (BUSPAR) 15 MG tablet, Take 1 tablet BID, Disp: 180 tablet, Rfl: 1   citalopram (CELEXA) 40 MG tablet, Take 1 tablet daily, Disp: 90 tablet, Rfl: 1   LORazepam (ATIVAN) 0.5 MG tablet, Sue Little to take 1/4 tablet to 1 tablet daily as needed., Disp: 30 tablet, Rfl: 2   metoprolol succinate (TOPROL-XL) 25 MG 24 hr tablet, Take 1 tablet (25 mg total) by mouth daily., Disp: 90 tablet, Rfl: 1   mineral oil liquid, Take by mouth at bedtime. Sue Little takes one tablespoon, Disp: , Rfl:    ipratropium (ATROVENT) 0.03 % nasal spray, Place 2 sprays into both nostrils every 12 (twelve) hours. (Patient not taking: Reported on 05/31/2020), Disp: 30 mL, Rfl: 12   nystatin ointment (MYCOSTATIN), Apply to affected area daily  (Patient not taking: Reported on 05/01/2021), Disp: 30 g, Rfl: 0   Omega-3 Fatty Acids (FISH OIL PO), Take 5 mLs by mouth daily. (Patient not taking: Reported on 05/31/2020), Disp: , Rfl:    Review of Systems:   ROS Negative unless otherwise specified per HPI. Vitals:   Vitals:   05/01/21 1330  BP: 122/70  Pulse: 74  Temp: 98.5 F (36.9 C)  TempSrc: Temporal  SpO2: 95%  Weight: 164 lb (74.4 kg)  Height: 5\' 2"  (1.575 m)     Body mass index is 30 kg/m.  Physical Exam:   Physical Exam Vitals and nursing note reviewed.  Constitutional:      General: She is not in acute distress.    Appearance: She is well-developed. She is not ill-appearing or toxic-appearing.  Cardiovascular:     Rate and Rhythm: Normal rate and regular rhythm.     Pulses: Normal pulses.     Heart sounds: Normal heart sounds, S1 normal and S2 normal.  Pulmonary:     Effort: Pulmonary effort is normal.     Breath sounds: Normal breath sounds.  Skin:    General: Skin is warm and dry.  Neurological:     Mental Status: She is alert.     GCS: GCS eye subscore is 4. GCS verbal subscore is 5. GCS motor subscore is 6.  Psychiatric:        Speech: Speech normal.        Behavior: Behavior normal. Behavior is cooperative.    Assessment and Plan:   Anxiety; Depression, major, single episode, mild (HCC) Controlled; Stable No red flags;Denies SI/HI Continue Celexa 40 mg daily, Buspar 15 mg twice daily, and Ativan 0.5 mg as needed  Provided patient with sleep hygiene recommendations  I advised patient that if they develop any SI, to tell someone immediately and seek medical attention Follow up in 3-6 months, sooner if concerns   PAD (peripheral artery disease) (HCC) No red flags  Recommended patient follow up with vascular surgery for further evaluation  Continue to encourage reduction of smoking  Follow up as needed  Hyperlipidemia, unspecified hyperlipidemia type; Refusal of statin medication by  patient Restart fish oil supplement daily  Discussed possibility of starting a statin medication if above regimen do not improve levels  Follow up in 3-6 months for blood work, sooner if concerns   Prediabetes Uncontrolled Patient declines medication intervention at this time  Encouraged patient to participate in healthy eating and regular exercise--provided handout information Follow up in 3 months for recheck of A1c, sooner if concerns occur    I,Havlyn C Ratchford,acting as a for Neurosurgeon,  PA.,have documented all relevant documentation on the behalf of Jarold Motto, PA,as directed by  Jarold Motto, PA while in the presence of Jarold Motto, [place].  I, Jarold Motto, [place], have reviewed all documentation for this visit. The documentation on 05/01/21 for the exam, diagnosis, procedures, and orders are all accurate and complete.   Jarold Motto, PA-C

## 2021-05-01 NOTE — Patient Instructions (Addendum)
It was great to see you!  Please try to get your advanced directives completed!! THIS IS IMPORTANT.  Call [facility] at [phone] to follow-up on the blood flow in your legs.  Restart your fish oil.  We will continue to talk about starting cholesterol medication.  PLEASE work on healthier breakfast.  Sleep Hygiene  Do: (1) Go to bed at the same time each day. (2) Get up from bed at the same time each day. (3) Get regular exercise each day, preferably in the morning.  There is goof evidence that regular exercise improves restful sleep.  This includes stretching and aerobic exercise. (4) Get regular exposure to outdoor or bright lights, especially in the late afternoon. (5) Keep the temperature in your bedroom comfortable. (6) Keep the bedroom quiet when sleeping. (7) Keep the bedroom dark enough to facilitate sleep. (8) Use your bed only for sleep and sex. (9) Take medications as directed.  It is helpful to take prescribed sleeping pills 1 hour before bedtime, so they are causing drowsiness when you lie down, or 10 hours before getting up, to avoid daytime drowsiness. (10) Use a relaxation exercise just before going to sleep -- imagery, massage, warm bath. (11) Keep your feet and hands warm.  Wear warm socks and/or mittens or gloves to bed.  Don't: (1) Exercise just before going to bed. (2) Engage in stimulating activity just before bed, such as playing a competitive game, watching an exciting program on television, or having an important discussion with a loved one. (3) Have caffeine in the evening (coffee, teas, chocolate, sodas, etc.) (4) Read or watch television in bed. (5) Use alcohol to help you sleep. (6) Go to bed too hungry or too full. (7) Take another person's sleeping pills. (8) Take over-the-counter sleeping pills, without your doctor's knowledge.  Tolerance can develop rapidly with these medications.  Diphenhydramine can have  serious side effects for elderly patients. (9) Take daytime naps. (10) Command yourself to go to sleep.  This only makes your mind and body more alert.  If you lie awake for more than 20-30 minutes, get up, go to a different room, participate in a quiet activity (Ex - non-excitable reading or television), and then return to bed when you feel sleepy.  Do this as many times during the night as needed.  This may cause you to have a night or two of poor sleep but it will train your brain to know when it is time for sleep.   Let's follow-up in 3-6 months, sooner if you have concerns.  Take care,  Jarold Motto PA-C

## 2021-05-03 ENCOUNTER — Ambulatory Visit: Payer: Medicare HMO | Admitting: Licensed Clinical Social Worker

## 2021-05-03 DIAGNOSIS — F419 Anxiety disorder, unspecified: Secondary | ICD-10-CM

## 2021-05-03 DIAGNOSIS — F320 Major depressive disorder, single episode, mild: Secondary | ICD-10-CM

## 2021-05-03 DIAGNOSIS — Z7189 Other specified counseling: Secondary | ICD-10-CM

## 2021-05-03 NOTE — Chronic Care Management (AMB) (Signed)
Chronic Care Management   Clinical Social Work Note  05/03/2021 Name: Sue Little MRN: [id number] DOB: [birth year]  Sue Little is a 81 y.o. year old female who is a primary care patient of Inda Coke, [place]. The CCM team was consulted to assist the patient with chronic disease management and/or care coordination needs related to: Level of Care Concerns and Advanced Directive Education.   Engaged with patient by telephone for follow up visit in response to provider referral for social work chronic care management and care coordination services.   Consent to Services:  The patient was given information about Chronic Care Management services, agreed to services, and gave verbal consent prior to initiation of services.  Please see initial visit note for detailed documentation.   Patient agreed to services and consent obtained.   Summary: Assessed patient's current treatment, progress, coping skills, support system and barriers to care.  She is making progress with managing symptoms of depression and anxiety and continues to see her therapist , but continues to have difficulty with moving forward for long-term care planning and exploring options .  See Care Plan below for interventions and patient self-care actives.  Recommendation: Patient may benefit from, and is in agreement to continue to explore long-term care options and discuss concerns with her sister and daughter.   Follow up Plan: Patient would like continued follow-up from CCM LCSW.  per patient's request will follow up in 30 to 45 days.  Will call office if needed prior to next encounter.   Assessment: Review of patient past medical history, allergies, medications, and health status, including review of relevant consultants reports was performed today as part of a comprehensive evaluation and provision of chronic care management and care coordination services.     SDOH (Social Determinants of Health) assessments and interventions  performed:    Advanced Directives Status: See Care Plan for related entries.  CCM Care Plan  Conditions to be addressed/monitored: Anxiety and Depression; Level of care concerns  Care Plan : LCSW Plan of Care  Updates made by Maurine Cane, LCSW since 05/03/2021 12:00 AM     Problem: Long-Term Care Planning      Goal: Effective Long-Term Care Planning by exploring community options   Start Date: 03/29/2021  Recent Progress: On track  Priority: High  Note:   Current Barriers:  Level of Care Concerns:limited support and difficulty getting help from family No Advanced Directives in place Lacks knowledge of how to connect   Sue Little):  Patient  will work with Education officer, museum to address concerns related to advance care planning will follow up with resources discussed to day as discussed with Education officer, museum during this encounter through collaboration with Holiday representative, provider, and care team.   Interventions: 1:1 collaboration with primary care provider regarding development and update of comprehensive plan of care as evidenced by provider attestation and co-signature Inter-disciplinary care team collaboration (see longitudinal plan of care) Evaluation of current treatment plan related to  self management and patient's adherence to plan as established by provider Review resources, discussed options and provided patient information about  Transportation provided by insurance provider Enhanced Benefits connected with insurance provider  Referral for [employer] (community support) Services provided by [employer] (has paper work) Herbalist as an options  Level of Care Concerns in a patient with HTN, Anxiety, and Depression:  (Status: Goal on Track (progressing): YES.) Current level of care: home, alone Evaluation of  patient safety in current living environment Motivational Interviewing employed Solution-Focused  Strategies employed:  Active listening / Reflection utilized  Problem Solving [place] strategies reviewed Long-term care discussion vs crisis care [place] understanding of Advance Directives.  and EMMI educational information on Advance Directives sent via e-mail   Patient Self-Care Activities: Continue with therapy Review EMMI educational information on Advance Directive and choosing a Health Care Proxy. Look for an e-mail from [facility]. Complete Advance Directive packet Continue to explore long-term care options and review list      Casimer Lanius, LCSW Licensed Clinical Social Worker [facility]  [facility] [place] [phone]

## 2021-05-03 NOTE — Patient Instructions (Signed)
Visit Information  Thank you for taking time to visit with me today. Please don't hesitate to contact me if I can be of assistance to you before our next scheduled telephone appointment.  Following are the goals we discussed today: Long Term Care planning  Patient Self-Care Activities: Continue with therapy Review EMMI educational information on Advance Directive and choosing a Health Care Proxy. Look for an e-mail from [facility]. Complete Advance Directive packet Continue to explore long-term care options and review list  Our next appointment is by telephone on March 30th at 11:30  Please call the care guide team at [phone] if you need to cancel or reschedule your appointment.   If you are experiencing a Mental Health or Behavioral Health Crisis or need someone to talk to, please call 1-800-273-TALK (toll free, 24 hour hotline)   Patient verbalizes understanding of instructions and care plan provided today and agrees to view in MyChart. Active MyChart status confirmed with patient.    Sammuel Hines, LCSW Licensed Clinical Social Worker [facility]  [facility] [place] [phone]

## 2021-05-04 ENCOUNTER — Ambulatory Visit: Payer: Medicare HMO | Admitting: Psychologist

## 2021-05-08 DIAGNOSIS — F320 Major depressive disorder, single episode, mild: Secondary | ICD-10-CM

## 2021-05-16 ENCOUNTER — Ambulatory Visit: Payer: Medicare HMO | Admitting: Psychologist

## 2021-05-28 ENCOUNTER — Ambulatory Visit (INDEPENDENT_AMBULATORY_CARE_PROVIDER_SITE_OTHER): Payer: Medicare HMO

## 2021-05-28 ENCOUNTER — Other Ambulatory Visit: Payer: Self-pay

## 2021-05-28 DIAGNOSIS — Z0000 Encounter for general adult medical examination without abnormal findings: Secondary | ICD-10-CM

## 2021-05-28 NOTE — Patient Instructions (Signed)
Sue Little , ?Thank you for taking time to come for your Medicare Wellness Visit. I appreciate your ongoing commitment to your health goals. Please review the following plan we discussed and let me know if I can assist you in the future.  ? ?Screening recommendations/referrals: ?Colonoscopy: No longer required  ?Mammogram: Done 04/23/21 repeat every year  ?Bone Density: pt stated completed  ?Recommended yearly ophthalmology/optometry visit for glaucoma screening and checkup ?Recommended yearly dental visit for hygiene and checkup ? ?Vaccinations: ?Influenza vaccine: Declined at this time ?Pneumococcal vaccine: Up to date ?Tdap vaccine: Done 12/07/19  ?Shingles vaccine: Shingrix discussed. Please contact your pharmacy for coverage information.    ?Covid-19:Completed 1/12, 04/20/19 & 04/14/20 ? ?Advanced directives: Please bring a copy of your health care power of attorney and living will to the office at your convenience. ? ?Conditions/risks identified: None at this time ? ?Next appointment: Follow up in one year for your annual wellness visit  ? ? ?Preventive Care 80 Years and Older, Female ?Preventive care refers to lifestyle choices and visits with your health care provider that can promote health and wellness. ?What does preventive care include? ?A yearly physical exam. This is also called an annual well check. ?Dental exams once or twice a year. ?Routine eye exams. Ask your health care provider how often you should have your eyes checked. ?Personal lifestyle choices, including: ?Daily care of your teeth and gums. ?Regular physical activity. ?Eating a healthy diet. ?Avoiding tobacco and drug use. ?Limiting alcohol use. ?Practicing safe sex. ?Taking low-dose aspirin every day. ?Taking vitamin and mineral supplements as recommended by your health care provider. ?What happens during an annual well check? ?The services and screenings done by your health care provider during your annual well check will depend on your age,  overall health, lifestyle risk factors, and family history of disease. ?Counseling  ?Your health care provider may ask you questions about your: ?Alcohol use. ?Tobacco use. ?Drug use. ?Emotional well-being. ?Home and relationship well-being. ?Sexual activity. ?Eating habits. ?History of falls. ?Memory and ability to understand (cognition). ?Work and work Astronomer. ?Reproductive health. ?Screening  ?You may have the following tests or measurements: ?Height, weight, and BMI. ?Blood pressure. ?Lipid and cholesterol levels. These may be checked every 5 years, or more frequently if you are over 58 years old. ?Skin check. ?Lung cancer screening. You may have this screening every year starting at age 64 if you have a 30-pack-year history of smoking and currently smoke or have quit within the past 15 years. ?Fecal occult blood test (FOBT) of the stool. You may have this test every year starting at age 60. ?Flexible sigmoidoscopy or colonoscopy. You may have a sigmoidoscopy every 5 years or a colonoscopy every 10 years starting at age 38. ?Hepatitis C blood test. ?Hepatitis B blood test. ?Sexually transmitted disease (STD) testing. ?Diabetes screening. This is done by checking your blood sugar (glucose) after you have not eaten for a while (fasting). You may have this done every 1-3 years. ?Bone density scan. This is done to screen for osteoporosis. You may have this done starting at age 69. ?Mammogram. This may be done every 1-2 years. Talk to your health care provider about how often you should have regular mammograms. ?Talk with your health care provider about your test results, treatment options, and if necessary, the need for more tests. ?Vaccines  ?Your health care provider may recommend certain vaccines, such as: ?Influenza vaccine. This is recommended every year. ?Tetanus, diphtheria, and acellular pertussis (Tdap, Td) vaccine.  You may need a Td booster every 10 years. ?Zoster vaccine. You may need this after age  49. ?Pneumococcal 13-valent conjugate (PCV13) vaccine. One dose is recommended after age 25. ?Pneumococcal polysaccharide (PPSV23) vaccine. One dose is recommended after age 27. ?Talk to your health care provider about which screenings and vaccines you need and how often you need them. ?This information is not intended to replace advice given to you by your health care provider. Make sure you discuss any questions you have with your health care provider. ?Document Released: 03/24/2015 Document Revised: 11/15/2015 Document Reviewed: 12/27/2014 ?Elsevier Interactive Patient Education ? 2017 [employer]. ? ?Fall Prevention in the Home ?Falls can cause injuries. They can happen to people of all ages. There are many things you can do to make your home safe and to help prevent falls. ?What can I do on the outside of my home? ?Regularly fix the edges of walkways and driveways and fix any cracks. ?Remove anything that might make you trip as you walk through a door, such as a raised step or threshold. ?Trim any bushes or trees on the path to your home. ?Use bright outdoor lighting. ?Clear any walking paths of anything that might make someone trip, such as rocks or tools. ?Regularly check to see if handrails are loose or broken. Make sure that both sides of any steps have handrails. ?Any raised decks and porches should have guardrails on the edges. ?Have any leaves, snow, or ice cleared regularly. ?Use sand or salt on walking paths during winter. ?Clean up any spills in your garage right away. This includes oil or grease spills. ?What can I do in the bathroom? ?Use night lights. ?Install grab bars by the toilet and in the tub and shower. Do not use towel bars as grab bars. ?Use non-skid mats or decals in the tub or shower. ?If you need to sit down in the shower, use a plastic, non-slip stool. ?Keep the floor dry. Clean up any water that spills on the floor as soon as it happens. ?Remove soap buildup in the tub or shower  regularly. ?Attach bath mats securely with double-sided non-slip rug tape. ?Do not have throw rugs and other things on the floor that can make you trip. ?What can I do in the bedroom? ?Use night lights. ?Make sure that you have a light by your bed that is easy to reach. ?Do not use any sheets or blankets that are too big for your bed. They should not hang down onto the floor. ?Have a firm chair that has side arms. You can use this for support while you get dressed. ?Do not have throw rugs and other things on the floor that can make you trip. ?What can I do in the kitchen? ?Clean up any spills right away. ?Avoid walking on wet floors. ?Keep items that you use a lot in easy-to-reach places. ?If you need to reach something above you, use a strong step stool that has a grab bar. ?Keep electrical cords out of the way. ?Do not use floor polish or wax that makes floors slippery. If you must use wax, use non-skid floor wax. ?Do not have throw rugs and other things on the floor that can make you trip. ?What can I do with my stairs? ?Do not leave any items on the stairs. ?Make sure that there are handrails on both sides of the stairs and use them. Fix handrails that are broken or loose. Make sure that handrails are as long as  the stairways. ?Check any carpeting to make sure that it is firmly attached to the stairs. Fix any carpet that is loose or worn. ?Avoid having throw rugs at the top or bottom of the stairs. If you do have throw rugs, attach them to the floor with carpet tape. ?Make sure that you have a light switch at the top of the stairs and the bottom of the stairs. If you do not have them, ask someone to add them for you. ?What else can I do to help prevent falls? ?Wear shoes that: ?Do not have high heels. ?Have rubber bottoms. ?Are comfortable and fit you well. ?Are closed at the toe. Do not wear sandals. ?If you use a stepladder: ?Make sure that it is fully opened. Do not climb a closed stepladder. ?Make sure that  both sides of the stepladder are locked into place. ?Ask someone to hold it for you, if possible. ?Clearly mark and make sure that you can see: ?Any grab bars or handrails. ?First and last steps. ?Where the

## 2021-05-28 NOTE — Progress Notes (Addendum)
Virtual Visit via Telephone Note ? ?I connected with  Sue PrettyBrenda R Caba on 05/28/21 at 10:15 AM EDT by telephone and verified that I am speaking with the correct person using two identifiers. ? ?Medicare Annual Wellness visit completed telephonically due to Covid-19 pandemic.  ? ?Persons participating in this call: This Health Coach and this patient.  ? ?Location: ?Patient: Home  ?Provider: office ?  ?I discussed the limitations, risks, security and privacy concerns of performing an evaluation and management service by telephone and the availability of in person appointments. The patient expressed understanding and agreed to proceed. ? ?Unable to perform video visit due to video visit attempted and failed and/or patient does not have video capability.  ? ?Some vital signs may be absent or patient reported.  ? ?Marzella Schleinina H Lakendria Nicastro, LPN ? ? ?Subjective:  ? Sue Little is a 81 y.o. female who presents for an Initial Medicare Annual Wellness Visit. ? ?Review of Systems    ? ?Cardiac Risk Factors include: advanced age (>2455men, 47>65 women);hypertension;dyslipidemia;smoking/ tobacco exposure ? ?   ?Objective:  ?  ?There were no vitals filed for this visit. ?There is no height or weight on file to calculate BMI. ? ?Advanced Directives 05/28/2021 07/13/2020  ?Does Patient Have a Medical Advance Directive? Yes Yes  ?Type of Estate agentAdvance Directive Healthcare Power of State Street Corporationttorney Healthcare Power of ClydeAttorney;Living will  ?Does patient want to make changes to medical advance directive? - No - Patient declined  ?Copy of Healthcare Power of Attorney in Chart? No - copy requested -  ? ? ?Current Medications (verified) ?Outpatient Encounter Medications as of 05/28/2021  ?Medication Sig  ? aspirin EC 81 MG tablet Take 81 mg by mouth daily. Swallow whole.  ? busPIRone (BUSPAR) 15 MG tablet Take 1 tablet BID  ? citalopram (CELEXA) 40 MG tablet Take 1 tablet daily  ? ipratropium (ATROVENT) 0.03 % nasal spray Place 2 sprays into both nostrils every 12  (twelve) hours.  ? LORazepam (ATIVAN) 0.5 MG tablet Pt to take 1/4 tablet to 1 tablet daily as needed.  ? metoprolol succinate (TOPROL-XL) 25 MG 24 hr tablet Take 1 tablet (25 mg total) by mouth daily.  ? mineral oil liquid Take by mouth at bedtime. Pt takes one tablespoon  ? nystatin ointment (MYCOSTATIN) Apply to affected area daily  ? Omega-3 Fatty Acids (FISH OIL PO) Take 5 mLs by mouth daily. (Patient not taking: Reported on 05/31/2020)  ? ?No facility-administered encounter medications on file as of 05/28/2021.  ? ? ?Allergies (verified) ?Procaine, Ivp dye [iodinated contrast media], and Sulfa antibiotics  ? ?History: ?Past Medical History:  ?Diagnosis Date  ? Allergy   ? Anxiety disorder   ? Depression   ? History of chicken pox   ? Hyperlipidemia   ? Hypertension   ? Tobacco abuse   ? started at age 81 years  ? ?Past Surgical History:  ?Procedure Laterality Date  ? ABDOMINAL HYSTERECTOMY  1978  ? CARDIAC CATHETERIZATION    ? esohagus stretched    ? TONSILLECTOMY    ? ?Family History  ?Problem Relation Age of Onset  ? Diabetes Mother   ? Heart disease Mother   ? Coronary artery disease Mother   ? Dementia Mother   ? Alcohol abuse Father   ? Heart attack Father   ? Depression Sister   ? Lung cancer Maternal Grandfather   ? Osteoarthritis Sister   ? Depression Sister   ? Prostate cancer Brother   ? Prostate cancer  Brother   ? ?Social History  ? ?Socioeconomic History  ? Marital status: Widowed  ?  Spouse name: Not on file  ? Number of children: Not on file  ? Years of education: Not on file  ? Highest education level: Not on file  ?Occupational History  ? Not on file  ?Tobacco Use  ? Smoking status: Every Day  ?  Packs/day: 1.00  ?  Types: Cigarettes  ?  Start date: 03/11/1960  ? Smokeless tobacco: Never  ?Vaping Use  ? Vaping Use: Never used  ?Substance and Sexual Activity  ? Alcohol use: Never  ? Drug use: Never  ? Sexual activity: Not on file  ?Other Topics Concern  ? Not on file  ?Social History Narrative  ?  Moved from [place], [place] to [place] in Oct 2020  ? Widowed in 2009  ? 3 children  ? 6 grandchildren  ? Retired Customer service manager  ? Hobbies: reading, sleep, 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, solitaire  ? ?Social Determinants of Health  ? ?Financial Resource Strain: Low Risk   ? Difficulty of Paying Living Expenses: Not very hard  ?Food Insecurity: No Food Insecurity  ? Worried About Programme researcher, broadcasting/film/video in the Last Year: Never true  ? Ran Out of Food in the Last Year: Never true  ?Transportation Needs: No Transportation Needs  ? Lack of Transportation (Medical): No  ? Lack of Transportation (Non-Medical): No  ?Physical Activity: Insufficiently Active  ? Days of Exercise per Week: 7 days  ? Minutes of Exercise per Session: 10 min  ?Stress: No Stress Concern Present  ? Feeling of Stress : Not at all  ?Social Connections: Socially Isolated  ? Frequency of Communication with Friends and Family: More than three times a week  ? Frequency of Social Gatherings with Friends and Family: More than three times a week  ? Attends Religious Services: Never  ? Active Member of Clubs or Organizations: No  ? Attends Banker Meetings: Never  ? Marital Status: Widowed  ? ? ?Tobacco Counseling ?Ready to quit: Not Answered ?Counseling given: Not Answered ? ? ?Clinical Intake: ? ?Pre-visit preparation completed: Yes ? ?Pain : No/denies pain ? ?  ? ?BMI - recorded: 30 ?Nutritional Status: BMI > 30  Obese ?Nutritional Risks: None ?Diabetes: No ? ?How often do you need to have someone help you when you read instructions, pamphlets, or other written materials from your doctor or pharmacy?: 1 - Never ? ?Diabetic?no ? ?Interpreter Needed?: No ? ?Information entered by :: Lanier Ensign, LPN ? ? ?Activities of Daily Living ?In your present state of health, do you have any difficulty performing the following activities: 05/28/2021  ?Hearing? N  ?Vision? N  ?Difficulty concentrating or making decisions? N  ?Walking or climbing stairs? N   ?Dressing or bathing? N  ?Doing errands, shopping? N  ?Preparing Food and eating ? N  ?Using the Toilet? N  ?In the past six months, have you accidently leaked urine? Y  ?Comment wears a pad  ?Do you have problems with loss of bowel control? N  ?Managing your Medications? N  ?Managing your Finances? N  ?Housekeeping or managing your Housekeeping? N  ?Some recent data might be hidden  ? ? ?Patient Care Team: ?Jarold Motto, [place] as PCP - General (Physician Assistant) ?Soundra Pilon, LCSW as Child psychotherapist (Licensed Visual merchandiser) ? ?Indicate any recent Medical Services you may have received from other than [facility] providers in the past year (date  may be approximate). ? ?   ?Assessment:  ? This is a routine wellness examination for Sue Little. ? ?Hearing/Vision screen ?Hearing Screening - Comments:: Pt denies any hearing issues  ?Vision Screening - Comments:: Pt follows up with [facility] for annual eye exams  ? ?Dietary issues and exercise activities discussed: ?Current Exercise Habits: Home exercise routine, Type of exercise: walking, Time (Minutes): 10, Frequency (Times/Week): 7, Weekly Exercise (Minutes/Week): 70 ? ? Goals Addressed   ? ?  ?  ?  ?  ? This Visit's Progress  ?  Patient Stated     ?  None at this time  ?  ? ?  ? ?Depression Screen ?PHQ 2/9 Scores 05/28/2021 05/01/2021 03/21/2021 05/31/2020 07/05/2019  ?PHQ - 2 Score 1 2 3 1 2   ?PHQ- 9 Score - 7 17 8 11   ?  ?Fall Risk ?Fall Risk  05/28/2021  ?Falls in the past year? 0  ?Number falls in past yr: 0  ?Injury with Fall? 0  ?Risk for fall due to : Impaired vision;Impaired balance/gait  ?Follow up Falls prevention discussed  ? ? ?FALL RISK PREVENTION PERTAINING TO THE HOME: ? ?Any stairs in or around the home? Yes  ?If so, are there any without handrails? No  ?Home free of loose throw rugs in walkways, pet beds, electrical cords, etc? Yes  ?Adequate lighting in your home to reduce risk of falls? Yes  ? ?ASSISTIVE DEVICES UTILIZED TO PREVENT FALLS: ? ?Life  alert? No  ?Use of a cane, walker or w/c? No  ?Grab bars in the bathroom? Yes  ?Shower chair or bench in shower? No  ?Elevated toilet seat or a handicapped toilet? No  ? ?TIMED UP AND GO: ? ?Was the test performed

## 2021-05-31 ENCOUNTER — Other Ambulatory Visit: Payer: Self-pay | Admitting: Physician Assistant

## 2021-06-01 ENCOUNTER — Ambulatory Visit: Payer: Medicare HMO | Admitting: Psychologist

## 2021-06-07 ENCOUNTER — Telehealth: Payer: Self-pay | Admitting: Physician Assistant

## 2021-06-07 ENCOUNTER — Ambulatory Visit (INDEPENDENT_AMBULATORY_CARE_PROVIDER_SITE_OTHER): Payer: Medicare HMO | Admitting: Licensed Clinical Social Worker

## 2021-06-07 DIAGNOSIS — F419 Anxiety disorder, unspecified: Secondary | ICD-10-CM

## 2021-06-07 DIAGNOSIS — F320 Major depressive disorder, single episode, mild: Secondary | ICD-10-CM

## 2021-06-07 NOTE — Patient Instructions (Signed)
? ?  It was a pleasure speaking with you today. ?I am sorry you were unable to keep your phone appointment today.   ?per your request your appointment is scheduled April 13th at 11:30 ?Please call the office if needed ? ?Sammuel Hines, LCSW ?Licensed Clinical Social Worker [facility]  ?[facility] [place] ?[phone]  ?

## 2021-06-07 NOTE — Chronic Care Management (AMB) (Signed)
?  Chronic Care Management  ? Clinical Social Work Note ? ?06/07/2021 ?Name: Sue Little MRN: [id number] DOB: Jun 15, 1940 ? ?Sue Little is a 81 y.o. year old female who is a primary care patient of Inda Coke, [place]. The CCM team was consulted to assist the patient with chronic disease management and/or care coordination needs related to: Holiday representative.  ? ?Engaged with patient by telephone for follow up visit in response to provider referral for social work chronic care management and care coordination services.  ? ?Consent to Services:  ?The patient was given information about Chronic Care Management services, agreed to services, and gave verbal consent prior to initiation of services.  Please see initial visit note for detailed documentation.  ?Patient agreed to services and consent obtained.  ? ?Summary:  Patient reports not feeling well and wants to reschedule phone appointment today to address care plan goals.  Reports cough, shortness of breath and weakness since Sunday.  Has not called providers office .   ? ?Recommendation: Patient may benefit from, and is in agreement to call Dr. Gwynneth Munson office when she hangs up to discuss symptoms and concerns. ? ?Follow up Plan: Patient would like continued follow-up from CCM LCSW.  per patient's request will follow up in 2 week to address care plan goals.  Will call office if needed prior to next encounter. ?  ? ?Assessment: Review of patient past medical history, allergies, medications, and health status, including review of relevant consultants reports was performed today as part of a comprehensive evaluation and provision of chronic care management and care coordination services.    ? ?SDOH (Social Determinants of Health) assessments and interventions performed:   ? ?Advanced Directives Status: Not addressed in this encounter. ? ?Casimer Lanius, LCSW ?Licensed Clinical Social Worker [facility]  ?[place] ?[phone]  ? ? ? ? ?

## 2021-06-07 NOTE — Telephone Encounter (Signed)
Patient is having a bad a bad cold. Stated she is taking BP meds and is aware she cant just taker anything over the counter. Stated It hurts to cough, her bp levels are under control per patient. She states its sinus related. Would like to know what to do.  ?

## 2021-06-08 ENCOUNTER — Ambulatory Visit (INDEPENDENT_AMBULATORY_CARE_PROVIDER_SITE_OTHER): Payer: Medicare HMO | Admitting: Physician Assistant

## 2021-06-08 ENCOUNTER — Encounter: Payer: Self-pay | Admitting: Physician Assistant

## 2021-06-08 VITALS — BP 122/70 | HR 67 | Temp 97.7°F | Ht 62.0 in | Wt 161.0 lb

## 2021-06-08 DIAGNOSIS — R079 Chest pain, unspecified: Secondary | ICD-10-CM

## 2021-06-08 DIAGNOSIS — E785 Hyperlipidemia, unspecified: Secondary | ICD-10-CM | POA: Diagnosis not present

## 2021-06-08 DIAGNOSIS — R051 Acute cough: Secondary | ICD-10-CM | POA: Diagnosis not present

## 2021-06-08 DIAGNOSIS — F320 Major depressive disorder, single episode, mild: Secondary | ICD-10-CM

## 2021-06-08 LAB — POC COVID19 BINAXNOW: SARS Coronavirus 2 Ag: NEGATIVE

## 2021-06-08 MED ORDER — AZITHROMYCIN 250 MG PO TABS: ORAL_TABLET | ORAL | 0 refills | Status: AC

## 2021-06-08 NOTE — Patient Instructions (Signed)
It was great to see you! ? ?Start azithromycin antibiotic ? ?Follow-up with me in May to discuss your heart health. ? ?If worsening symptoms over the weekend, please go to the ER. ? ?Take care, ? ?Jarold Motto PA-C  ? ? ?

## 2021-06-08 NOTE — Telephone Encounter (Signed)
Pt cannot come in person- wants vv only- stated she doesn't have energy to shower this AM-  ?

## 2021-06-08 NOTE — Progress Notes (Signed)
ANALEISE MCCLEERY is a 81 y.o. female here for a cough. ? ?History of Present Illness:  ? ?Chief Complaint  ?Patient presents with  ? cold symptoms  ?  Pt c/o runny nose with yellow tent.  ? Cough  ?  Pt states she has had bronchitis before.  ? ? ?HPI ? ?Cough ?[place] presents with c/o cough that has been onset for five days and gradually worsening. Pt states her cough has transitioned into productive within the last couple of days and is painful. In addition to her cough, she has also been experiencing facial fullness, left ear popping, fatigue and rhinorrhea with yellow tinted mucous. Upon further discussion, she does admit she was recently around her granddaughter who is also sick. According to her, her granddaughter has tested negative for flu, strept, and covid. In an effort to manage her sx, she has been drinking orange juice regularly and sleeping as much as possible. Angeliyah does have a hx of bronchitis but doesn't believe current sx are similar to past illness.  ? ?Denies fever, chills, or SOB.   ? ?HLD; Chest Wall Discomfort ?Shailey admits she has been experiencing chest wall discomfort that she believes is just due to stress. The discomfort is near her mid/left back. Over the course of a couple of months, Dylann has been re-considering the idea of starting a statin medication to improve HLD levels. At this time she doesn't want any further work up of her chest wall discomfort, but is willing to start a statin once her current illness resolves.  ? ? ?Past Medical History:  ?Diagnosis Date  ? Allergy   ? Anxiety disorder   ? Depression   ? History of chicken pox   ? Hyperlipidemia   ? Hypertension   ? Tobacco abuse   ? started at age 81 years  ? ?  ?Social History  ? ?Tobacco Use  ? Smoking status: Every Day  ?  Packs/day: 1.00  ?  Types: Cigarettes  ?  Start date: 03/11/1960  ? Smokeless tobacco: Never  ?Vaping Use  ? Vaping Use: Never used  ?Substance Use Topics  ? Alcohol use: Never  ? Drug use: Never   ? ? ?Past Surgical History:  ?Procedure Laterality Date  ? ABDOMINAL HYSTERECTOMY  1978  ? CARDIAC CATHETERIZATION    ? esohagus stretched    ? TONSILLECTOMY    ? ? ?Family History  ?Problem Relation Age of Onset  ? Diabetes Mother   ? Heart disease Mother   ? Coronary artery disease Mother   ? Dementia Mother   ? Alcohol abuse Father   ? Heart attack Father   ? Depression Sister   ? Lung cancer Maternal Grandfather   ? Osteoarthritis Sister   ? Depression Sister   ? Prostate cancer Brother   ? Prostate cancer Brother   ? ? ?Allergies  ?Allergen Reactions  ? Procaine Other (See Comments)  ?  Hypotension, faint ? Can tolerate mepivacaine  ? Ivp Dye [Iodinated Contrast Media] Hives  ?  swelling  ? Sulfa Antibiotics Hives  ? ? ?Current Medications:  ? ?Current Outpatient Medications:  ?  aspirin EC 81 MG tablet, Take 81 mg by mouth daily. Swallow whole., Disp: , Rfl:  ?  busPIRone (BUSPAR) 15 MG tablet, Take 1 tablet BID, Disp: 180 tablet, Rfl: 1 ?  citalopram (CELEXA) 40 MG tablet, TAKE 1 TABLET EVERY DAY (NEED MD APPOINTMENT FOR MORE REFILLS), Disp: 90 tablet, Rfl: 1 ?  ipratropium (  ATROVENT) 0.03 % nasal spray, Place 2 sprays into both nostrils every 12 (twelve) hours., Disp: 30 mL, Rfl: 12 ?  LORazepam (ATIVAN) 0.5 MG tablet, Pt to take 1/4 tablet to 1 tablet daily as needed., Disp: 30 tablet, Rfl: 2 ?  metoprolol succinate (TOPROL-XL) 25 MG 24 hr tablet, Take 1 tablet (25 mg total) by mouth daily., Disp: 90 tablet, Rfl: 1 ?  mineral oil liquid, Take by mouth at bedtime. Pt takes one tablespoon, Disp: , Rfl:  ?  nystatin ointment (MYCOSTATIN), Apply to affected area daily, Disp: 30 g, Rfl: 0 ?  Omega-3 Fatty Acids (FISH OIL PO), Take 5 mLs by mouth daily., Disp: , Rfl:   ? ?Review of Systems:  ? ?ROS ?Negative unless otherwise specified per HPI. ?Vitals:  ? ?Vitals:  ? 06/08/21 1311  ?BP: 122/70  ?Pulse: 67  ?Temp: 97.7 ?F (36.5 ?C)  ?SpO2: 95%  ?Weight: 161 lb (73 kg)  ?Height: 5\' 2"  (1.575 m)  ?   ?Body mass  index is 29.45 kg/m?. ? ?Physical Exam:  ? ?Physical Exam ?Vitals and nursing note reviewed.  ?Constitutional:   ?   General: She is not in acute distress. ?   Appearance: She is well-developed. She is not ill-appearing or toxic-appearing.  ?Cardiovascular:  ?   Rate and Rhythm: Normal rate and regular rhythm.  ?   Pulses: Normal pulses.  ?   Heart sounds: Normal heart sounds, S1 normal and S2 normal.  ?Pulmonary:  ?   Effort: Pulmonary effort is normal.  ?   Breath sounds: Normal breath sounds.  ?Skin: ?   General: Skin is warm and dry.  ?Neurological:  ?   Mental Status: She is alert.  ?   GCS: GCS eye subscore is 4. GCS verbal subscore is 5. GCS motor subscore is 6.  ?Psychiatric:     ?   Speech: Speech normal.     ?   Behavior: Behavior normal. Behavior is cooperative.  ? ? ?Assessment and Plan:  ? ?Acute Cough  ?No red flags  ?Suspect possible sinusitis ?Start azithromycin 250 mg daily, two tablets one day one then one tablet on days 2-5 ?May trial Robitussin DM for additional relief as needed ?Encouraged patient to push fluids and get plenty of rest ?If new/worsening symptoms or concerns occur over the weekend, visit the ER immediately  ? ?HLD ?She is still unwilling to start statin but is now "considering this" ?Will continue to encourage and counsel ? ?Chest wall discomfort ?Denies exertional chest pain ?BP well controlled ?Recommend chest xray and EKG but she refuses at this time -- she is aware that not working this up could prevent from diagnosing issues with her heart ?Discussed that if symptoms worsen needs ER evaluation ? ? ?I,Havlyn C Ratchford,acting as a scribe for [place], PA.,have documented all relevant documentation on the behalf of [employer], PA,as directed by  Jarold Motto, PA while in the presence of Jarold Motto, Jarold Motto. ? ?[place], PA, have reviewed all documentation for this visit. The documentation on 06/08/21 for the exam, diagnosis, procedures, and orders  are all accurate and complete. ? ? ?06/10/21, PA-C ? ?

## 2021-06-21 ENCOUNTER — Telehealth: Payer: Self-pay | Admitting: Licensed Clinical Social Worker

## 2021-06-21 ENCOUNTER — Telehealth: Payer: Medicare HMO

## 2021-06-21 NOTE — Chronic Care Management (AMB) (Signed)
? ?   Clinical Social Work  ?Care Management  ? Phone Outreach  ? ? ?06/21/2021 ?Name: Sue Little MRN: [id number] DOB: September 13, 1940 ? ?Sue Little is a 81 y.o. year old female who is a primary care patient of Jarold Motto, [place] .  ? ?Reason for referral: Advanced Directive Education.   ? ?F/U phone call today to assess needs, progress and barriers with care plan goals.   Telephone outreach was unsuccessful. A HIPPA compliant phone message was left for the patient providing contact information and requesting a return call.  ? ?Plan:CCM LCSW will wait for return call. If no return call is received, Will route chart to Care Guide to see if patient's caregiver would like to reschedule phone appointment  ? ?Review of patient status, including review of consultants reports, relevant laboratory and other test results, and collaboration with appropriate care team members and the patient's provider was performed as part of comprehensive patient evaluation and provision of care management services.   ? ?Sammuel Hines, LCSW ?Licensed Clinical Social Worker [facility]  ?[facility] [place] ?[phone]  ? ? ?  ? ? ?  ?

## 2021-06-25 ENCOUNTER — Telehealth: Payer: Self-pay | Admitting: *Deleted

## 2021-06-25 NOTE — Chronic Care Management (AMB) (Signed)
?  Care Management  ? ?Note ? ?06/25/2021 ?Name: Sue Little MRN: [id number] DOB: 10-25-1940 ? ?Sue Little is a 81 y.o. year old female who is a primary care patient of Inda Coke, [place] and is actively engaged with the care management team. I reached out to [name] by phone today to assist with re-scheduling a follow up visit with the Licensed Clinical Social Worker ? ?Follow up plan: ?Unsuccessful telephone outreach attempt made. A HIPAA compliant phone message was left for the patient providing contact information and requesting a return call.  ? ?Sue Little, CCMA ?Care Guide, Embedded Care Coordination ?[place]  Care Management  ?Direct Dial: [phone] ? ? ?

## 2021-06-28 ENCOUNTER — Ambulatory Visit (INDEPENDENT_AMBULATORY_CARE_PROVIDER_SITE_OTHER): Payer: Medicare HMO | Admitting: Licensed Clinical Social Worker

## 2021-06-28 ENCOUNTER — Encounter: Payer: Self-pay | Admitting: Licensed Clinical Social Worker

## 2021-06-28 DIAGNOSIS — F419 Anxiety disorder, unspecified: Secondary | ICD-10-CM

## 2021-06-28 DIAGNOSIS — F320 Major depressive disorder, single episode, mild: Secondary | ICD-10-CM

## 2021-06-28 NOTE — Chronic Care Management (AMB) (Signed)
?Chronic Care Management  ? Clinical Social Work Note ? ?06/28/2021 ?Name: Sue Little MRN: [id number] DOB: 1940/07/14 ? ?Sue Little is a 81 y.o. year old female who is a primary care patient of Sue Little, [place]. The CCM team was consulted to assist the patient with chronic disease management and/or care coordination needs related to: Holiday representative and Mental Health Counseling and Resources.  ? ?Engaged with patient by telephone for follow up visit in response to provider referral for social work chronic care management and care coordination services.  ? ?Consent to Services:  ?The patient was given information about Chronic Care Management services, agreed to services, and gave verbal consent prior to initiation of services.  Please see initial visit note for detailed documentation.  ? ?Patient agreed to services and consent obtained.  ? ? ?Summary: Assessed patient's current treatment, progress, coping skills, support system and barriers to care.  She continues to maintain positive progress with care plan goals..Reports doing well at this time.  Has completed Advance Directive but not ready to move forward with Assisted living options.  See Care Plan below for interventions and patient self-care actives. ? ?Recommendation: Patient may benefit from, and is in agreement to contact [facility] to schedule appointment with therapist.  ? ?Follow up Plan:  ?Patient does not desire continued follow-up by CCM LCSW. Will contact the office if needed ?Patient may benefit from and is in agreement for CCM LCSW to remain part of care team for the next 90 days.   ?Will have care guide contact patient to schedule 90 check in If no needs are identified in the next 90 days, CCM LSCW will disconnect from the care team. ?  ?Assessment: Review of patient past medical history, allergies, medications, and health status, including review of relevant consultants reports was performed today as part of a  comprehensive evaluation and provision of chronic care management and care coordination services.    ? ?SDOH (Social Determinants of Health) assessments and interventions performed:   ? ?Advanced Directives Status: See Care Plan for related entries. ? ?CCM Care Plan ?Conditions to be addressed/monitored: Anxiety and Depression;  ? ?Care Plan : LCSW Plan of Care  ?Updates made by Maurine Cane, LCSW since 06/28/2021 12:00 AM  ?  ? ?Problem: Long-Term Care Planning   ?  ? ?Goal: Effective Long-Term Care Planning by exploring community options   ?Start Date: 03/29/2021  ?This Visit's Progress: On track  ?Recent Progress: On track  ?Priority: High  ?Note:   ?Current Barriers:  ?Level of Care Concerns:limited support and difficulty getting help from family ?No Advanced Directives in place ?Lacks knowledge of how to connect  ? ?CSW Clinical Goal(s):  ?Patient  will work with Education officer, museum to address concerns related to advance care planning ?will follow up with resources discussed to day as discussed with Education officer, museum during this encounter through collaboration with Holiday representative, provider, and care team.  ? ?Interventions: ?1:1 collaboration with primary care provider regarding development and update of comprehensive plan of care as evidenced by provider attestation and co-signature ?Inter-disciplinary care team collaboration (see longitudinal plan of care) ?Evaluation of current treatment plan related to  self management and patient's adherence to plan as established by provider ?Review resources, discussed options and provided patient information about  ?Transportation provided by insurance provider ?Enhanced Benefits connected with insurance provider  ?Referral for [facility] (community support) ?Services provided by [employer]  ?Advance Directive Education (has completed paper  work) ?Explore Assisted Living options ( not ready at this time) ? ?Level of Care Concerns in a patient with HTN, Anxiety,  and Depression:  (Status: Patient declined further engagement on this goal.) ?Current level of care: home, alone ?Evaluation of patient safety in current living environment ?Solution-Focused Strategies employed:  ?Active listening / Reflection utilized  ?Problem Solving /Task Center strategies reviewed ?Long-term care discussion vs crisis care planning ?Discussed [place] ? ?Patient Self-Care Activities: ?Continue with therapy ?Complete Advance Directive packet ?Explore food options with meals at [employer] ?  ?  ?Sue Lanius, LCSW ?Licensed Clinical Social Worker [facility]  ?[place] ?[phone]  ? ?

## 2021-06-28 NOTE — Patient Instructions (Signed)
Visit Information ? ?Thank you for taking time to visit with me today. Please don't hesitate to contact me if I can be of assistance to you before our next scheduled telephone appointment. ? ?Following are the goals we discussed today: Advance Care Planning ? ?Patient Self-Care Activities: ?Continue with therapy, call to schedule your month appointment ?Complete Advance Directive packet and give copy to office ?Explore food options with meals at [employer] ? ?Please call the care guide team at [phone] if you need to schedule an appointment. The Care Guide will contact you to schedule the  90 day phone appointment   ? ?If you are experiencing a Mental Health or [place] or need someone to talk to, please call 1-800-273-TALK (toll free, 24 hour hotline)  ? ?The patient verbalized understanding of instructions, educational materials, and care plan provided today and agreed to receive a mailed copy of patient instructions, educational materials, and care plan.  ? ?Casimer Lanius, LCSW ?Licensed Clinical Social Worker [facility]  ?[place] ?[phone]  ?

## 2021-07-05 NOTE — Chronic Care Management (AMB) (Signed)
Per 06/28/2021 LCSW encounter, pt no longer needs f/u scheduled.  ? ?Jaquelyne Firkus, CCMA ?Care Guide, Embedded Care Coordination ?  Care Management  ?Direct Dial: [phone] ? ? ?

## 2021-08-30 ENCOUNTER — Ambulatory Visit (INDEPENDENT_AMBULATORY_CARE_PROVIDER_SITE_OTHER): Payer: Medicare HMO | Admitting: Physician Assistant

## 2021-08-30 ENCOUNTER — Encounter: Payer: Self-pay | Admitting: Physician Assistant

## 2021-08-30 VITALS — BP 124/70 | HR 65 | Temp 97.7°F | Ht 62.0 in | Wt 164.2 lb

## 2021-08-30 DIAGNOSIS — F419 Anxiety disorder, unspecified: Secondary | ICD-10-CM

## 2021-08-30 DIAGNOSIS — R42 Dizziness and giddiness: Secondary | ICD-10-CM

## 2021-08-30 DIAGNOSIS — L989 Disorder of the skin and subcutaneous tissue, unspecified: Secondary | ICD-10-CM

## 2021-08-30 DIAGNOSIS — F320 Major depressive disorder, single episode, mild: Secondary | ICD-10-CM

## 2021-08-30 DIAGNOSIS — R7303 Prediabetes: Secondary | ICD-10-CM

## 2021-08-30 DIAGNOSIS — E785 Hyperlipidemia, unspecified: Secondary | ICD-10-CM | POA: Diagnosis not present

## 2021-08-30 LAB — LIPID PANEL
Cholesterol: 208 mg/dL — ABNORMAL HIGH (ref 0–200)
HDL: 36.5 mg/dL — ABNORMAL LOW (ref >39.00)
Total CHOL/HDL Ratio: 6
Triglycerides: 457 mg/dL — ABNORMAL HIGH (ref 0.0–149.0)

## 2021-08-30 LAB — CBC WITH DIFFERENTIAL/PLATELET
Basophils Absolute: 0.1 10*3/uL (ref 0.0–0.1)
Basophils Relative: 0.8 % (ref 0.0–3.0)
Eosinophils Absolute: 0.3 10*3/uL (ref 0.0–0.7)
Eosinophils Relative: 3.2 % (ref 0.0–5.0)
HCT: 42.1 % (ref 36.0–46.0)
Hemoglobin: 14 g/dL (ref 12.0–15.0)
Lymphocytes Relative: 27.6 % (ref 12.0–46.0)
Lymphs Abs: 2.5 10*3/uL (ref 0.7–4.0)
MCHC: 33.3 g/dL (ref 30.0–36.0)
MCV: 85.7 fl (ref 78.0–100.0)
Monocytes Absolute: 0.6 10*3/uL (ref 0.1–1.0)
Monocytes Relative: 7 % (ref 3.0–12.0)
Neutro Abs: 5.5 10*3/uL (ref 1.4–7.7)
Neutrophils Relative %: 61.4 % (ref 43.0–77.0)
Platelets: 223 10*3/uL (ref 150.0–400.0)
RBC: 4.91 Mil/uL (ref 3.87–5.11)
RDW: 13.2 % (ref 11.5–15.5)
WBC: 8.9 10*3/uL (ref 4.0–10.5)

## 2021-08-30 LAB — COMPREHENSIVE METABOLIC PANEL
ALT: 14 U/L (ref 0–35)
AST: 11 U/L (ref 0–37)
Albumin: 4 g/dL (ref 3.5–5.2)
Alkaline Phosphatase: 71 U/L (ref 39–117)
BUN: 8 mg/dL (ref 6–23)
CO2: 30 mEq/L (ref 19–32)
Calcium: 10 mg/dL (ref 8.4–10.5)
Chloride: 103 mEq/L (ref 96–112)
Creatinine, Ser: 0.96 mg/dL (ref 0.40–1.20)
GFR: 56.16 mL/min — ABNORMAL LOW (ref >60.00)
Glucose, Bld: 133 mg/dL — ABNORMAL HIGH (ref 70–99)
Potassium: 3.9 mEq/L (ref 3.5–5.1)
Sodium: 139 mEq/L (ref 135–145)
Total Bilirubin: 0.4 mg/dL (ref 0.2–1.2)
Total Protein: 6.6 g/dL (ref 6.0–8.3)

## 2021-08-30 LAB — HEMOGLOBIN A1C: Hgb A1c MFr Bld: 6.7 % — ABNORMAL HIGH (ref 4.6–6.5)

## 2021-08-30 LAB — LDL CHOLESTEROL, DIRECT: Direct LDL: 102 mg/dL

## 2021-08-30 MED ORDER — CITALOPRAM HYDROBROMIDE 40 MG PO TABS: ORAL_TABLET | ORAL | 1 refills | Status: DC

## 2021-08-30 MED ORDER — LORAZEPAM 0.5 MG PO TABS: ORAL_TABLET | ORAL | 2 refills | Status: DC

## 2021-08-30 NOTE — Patient Instructions (Addendum)
It was great to see you!  I'm going to place a referral for a dermatologist today - try to schedule this for the fall  Let's follow-up in 3 months to review medications and discuss more screening tests  If your vertigo returns, call me  If a referral was placed today --> you will be contacted for an appointment. Please note that routine referrals can sometimes take up to 3-4 weeks to process. Please call our office if you haven't heard anything after this time frame.  If blood work, urine studies, or any imaging was ordered today -->  we will release your results to you on your MyChart account (if you have chosen to sign up for this) with further instructions. You may see the results before I do, but when I review them I will send you a message with my report or have my staff call you if things need to be discussed. Please reply to my message with any questions.   Take care,  Jarold Motto PA-C

## 2021-08-31 ENCOUNTER — Other Ambulatory Visit: Payer: Self-pay

## 2021-08-31 MED ORDER — METFORMIN HCL 500 MG PO TABS: 500.0000 mg | ORAL_TABLET | Freq: Every day | ORAL | 3 refills | Status: DC

## 2021-08-31 MED ORDER — ATORVASTATIN CALCIUM 40 MG PO TABS: ORAL_TABLET | ORAL | 0 refills | Status: DC

## 2021-10-20 ENCOUNTER — Other Ambulatory Visit: Payer: Self-pay | Admitting: Physician Assistant

## 2021-10-25 ENCOUNTER — Other Ambulatory Visit: Payer: Self-pay | Admitting: Physician Assistant

## 2021-11-29 ENCOUNTER — Other Ambulatory Visit: Payer: Self-pay | Admitting: Physician Assistant

## 2021-12-03 ENCOUNTER — Telehealth: Payer: Self-pay | Admitting: Physician Assistant

## 2021-12-03 ENCOUNTER — Ambulatory Visit: Payer: Medicare HMO | Admitting: Physician Assistant

## 2021-12-03 MED ORDER — CITALOPRAM HYDROBROMIDE 40 MG PO TABS: ORAL_TABLET | ORAL | 1 refills | Status: DC

## 2021-12-03 NOTE — Telephone Encounter (Signed)
Left message on voicemail Rx was sent to pharmacy as requested. 

## 2021-12-03 NOTE — Telephone Encounter (Signed)
   LAST APPOINTMENT DATE:   08/30/21 OV with PCP  NEXT APPOINTMENT DATE: 12/14/21 OV with PCP  MEDICATION: citalopram (CELEXA) 40 MG tablet [[id number]]   Is the patient out of medication?  Yes, first missed dose 09/24  PHARMACY: [facility] Store ID: [id number] [address]  [place], [place] [address] [phone]

## 2021-12-03 NOTE — Telephone Encounter (Signed)
[facility] [place] as requested.

## 2021-12-05 ENCOUNTER — Ambulatory Visit: Payer: Medicare HMO | Admitting: Physician Assistant

## 2021-12-14 ENCOUNTER — Encounter: Payer: Self-pay | Admitting: Physician Assistant

## 2021-12-14 ENCOUNTER — Ambulatory Visit (INDEPENDENT_AMBULATORY_CARE_PROVIDER_SITE_OTHER): Payer: Medicare HMO | Admitting: Physician Assistant

## 2021-12-14 VITALS — BP 116/70 | HR 62 | Temp 98.2°F | Ht 62.0 in | Wt 161.0 lb

## 2021-12-14 DIAGNOSIS — E785 Hyperlipidemia, unspecified: Secondary | ICD-10-CM | POA: Diagnosis not present

## 2021-12-14 DIAGNOSIS — R7303 Prediabetes: Secondary | ICD-10-CM

## 2021-12-14 LAB — POCT GLYCOSYLATED HEMOGLOBIN (HGB A1C): Hemoglobin A1C: 6.4 % — AB (ref 4.0–5.6)

## 2021-12-14 NOTE — Progress Notes (Signed)
Sue Little is a 81 y.o. female here for a follow up of a pre-existing problem.  History of Present Illness:   Chief Complaint  Patient presents with   Hypertension   Hyperlipidemia   Prediabetes      HLD Has not started lipitor 40 mg every other day. Afraid of side effects.   Diabetes 3 month follow-up. Current DM meds: metformin 500 mg daily. Blood sugars at home are: not checked. Patient is not compliant with medications. Denies: hypoglycemic or hyperglycemic episodes or symptoms. This patient's diabetes is complicated by smoking, HLD, HTN.  Lab Results  Component Value Date   HGBA1C 6.7 (H) 08/30/2021      Past Medical History:  Diagnosis Date   Allergy    Anxiety disorder    Depression    History of chicken pox    Hyperlipidemia    Hypertension    Tobacco abuse    started at age 59 years     Social History   Tobacco Use   Smoking status: Every Day    Packs/day: 1.00    Types: Cigarettes    Start date: 03/11/1960   Smokeless tobacco: Never  Vaping Use   Vaping Use: Never used  Substance Use Topics   Alcohol use: Never   Drug use: Never    Past Surgical History:  Procedure Laterality Date   ABDOMINAL HYSTERECTOMY  1978   CARDIAC CATHETERIZATION     esohagus stretched     TONSILLECTOMY      Family History  Problem Relation Age of Onset   Diabetes Mother    Heart disease Mother    Coronary artery disease Mother    Dementia Mother    Alcohol abuse Father    Heart attack Father    Depression Sister    Lung cancer Maternal Grandfather    Osteoarthritis Sister    Depression Sister    Prostate cancer Brother    Prostate cancer Brother     Allergies  Allergen Reactions   Procaine Other (See Comments)    Hypotension, faint  Can tolerate mepivacaine   Ivp Dye [Iodinated Contrast Media] Hives    swelling   Sulfa Antibiotics Hives    Current Medications:   Current Outpatient Medications:    aspirin EC 81 MG tablet, Take 81 mg by mouth  daily. Swallow whole., Disp: , Rfl:    busPIRone (BUSPAR) 15 MG tablet, TAKE 1 TABLET TWICE DAILY, Disp: 180 tablet, Rfl: 1   citalopram (CELEXA) 40 MG tablet, TAKE 1 TABLET EVERY DAY, Disp: 90 tablet, Rfl: 1   LORazepam (ATIVAN) 0.5 MG tablet, Pt to take 1/4 tablet to 1 tablet daily as needed., Disp: 30 tablet, Rfl: 2   metoprolol succinate (TOPROL-XL) 25 MG 24 hr tablet, Take 1 tablet (25 mg total) by mouth daily., Disp: 90 tablet, Rfl: 1   mineral oil liquid, Take by mouth at bedtime. Pt takes one tablespoon, Disp: , Rfl:    nystatin ointment (MYCOSTATIN), Apply to affected area daily, Disp: 30 g, Rfl: 0   Omega-3 Fatty Acids (FISH OIL PO), Take 5 mLs by mouth daily., Disp: , Rfl:    atorvastatin (LIPITOR) 40 MG tablet, TAKE 1 TABLET MY MOUTH EVERY OTHER DAY FOR CHOLESTEROL (Patient not taking: Reported on 12/14/2021), Disp: 30 tablet, Rfl: 0   metFORMIN (GLUCOPHAGE) 500 MG tablet, TAKE 1 TABLET BY MOUTH EVERY DAY WITH BREAKFAST (Patient not taking: Reported on 12/14/2021), Disp: 90 tablet, Rfl: 1   Review of Systems:  ROS Negative unless otherwise specified per HPI.  Vitals:   Vitals:   12/14/21 1054  BP: 116/70  Pulse: 62  Temp: 98.2 F (36.8 C)  TempSrc: Temporal  SpO2: 97%  Weight: 161 lb (73 kg)  Height: 5\' 2"  (1.575 m)     Body mass index is 29.45 kg/m.  Physical Exam:   Physical Exam Vitals and nursing note reviewed.  Constitutional:      General: She is not in acute distress.    Appearance: She is well-developed. She is not ill-appearing or toxic-appearing.  Cardiovascular:     Rate and Rhythm: Normal rate and regular rhythm.     Pulses: Normal pulses.     Heart sounds: Normal heart sounds, S1 normal and S2 normal.  Pulmonary:     Effort: Pulmonary effort is normal.     Breath sounds: Normal breath sounds.  Skin:    General: Skin is warm and dry.  Neurological:     Mental Status: She is alert.     GCS: GCS eye subscore is 4. GCS verbal subscore is 5. GCS  motor subscore is 6.  Psychiatric:        Speech: Speech normal.        Behavior: Behavior normal. Behavior is cooperative.    Results for orders placed or performed in visit on 12/14/21  POCT HgB A1C  Result Value Ref Range   Hemoglobin A1C 6.4 (A) 4.0 - 5.6 %    Assessment and Plan:   Hyperlipidemia, unspecified hyperlipidemia type Encouraged patient to start lipitor as prescribed Will also order CT calcium score to further characterize Follow-up in 3 months to recheck lipid panel  Prediabetes A1c has improved We will hold off metformin for now Follow-up in 3 months    02/13/22, PA-C

## 2021-12-14 NOTE — Patient Instructions (Signed)
It was great to see you!  Calcium score of your heart will be ordered -- someone will call you about this  Do not start the metformin at this time  Please start the lipitor every other day  Follow-up in 3 months  Take care,  Inda Coke PA-C

## 2021-12-20 ENCOUNTER — Other Ambulatory Visit: Payer: Self-pay | Admitting: Physician Assistant

## 2021-12-21 ENCOUNTER — Other Ambulatory Visit: Payer: Self-pay | Admitting: Physician Assistant

## 2022-02-04 ENCOUNTER — Ambulatory Visit (INDEPENDENT_AMBULATORY_CARE_PROVIDER_SITE_OTHER): Payer: Medicare HMO | Admitting: Family

## 2022-02-04 ENCOUNTER — Encounter: Payer: Self-pay | Admitting: Family

## 2022-02-04 VITALS — BP 142/78 | HR 82 | Temp 100.0°F | Ht 62.0 in | Wt 161.0 lb

## 2022-02-04 DIAGNOSIS — U071 COVID-19: Secondary | ICD-10-CM | POA: Diagnosis not present

## 2022-02-04 LAB — POC COVID19 BINAXNOW: SARS Coronavirus 2 Ag: POSITIVE — AB

## 2022-02-04 MED ORDER — MOLNUPIRAVIR EUA 200MG CAPSULE: 4.0000 | ORAL_CAPSULE | Freq: Two times a day (BID) | ORAL | 0 refills | Status: AC

## 2022-02-04 NOTE — Progress Notes (Signed)
Patient ID: Sue Little, female    DOB: 08/23/1940, 81 y.o.   MRN: [id number]  Chief Complaint  Patient presents with   Cough    Pt c/o cough, dry throat, headache and nasal drip since last night. Has nott ried any medications.     HPI:      URI sx:  pt reports having sx start yesterday with cough, scratchy throat, headache, nasal drainage, all hitting her suddenly. Did not check temp at home, but has fever in office and feels chills and shaky.        Assessment & Plan:  1. COVID-19 Sending Molnupiravir, pt advised of FDA label for emergency use, how to take, & SE. Advised of CDC guidelines for masking if out in public. OK to continue taking OTC sinus or pain meds. Encouraged to monitor & notify office of any worsening symptoms: increased shortness of breath, weakness, and signs of dehydration. Instructed to rest and hydrate well.   - POC COVID-19 - molnupiravir EUA (LAGEVRIO) 200 mg CAPS capsule; Take 4 capsules (800 mg total) by mouth 2 (two) times daily for 5 days.  Dispense: 40 capsule; Refill: 0   Subjective:    Outpatient Medications Prior to Visit  Medication Sig Dispense Refill   aspirin EC 81 MG tablet Take 81 mg by mouth daily. Swallow whole.     atorvastatin (LIPITOR) 40 MG tablet TAKE 1 TABLET MY MOUTH EVERY OTHER DAY FOR CHOLESTEROL 30 tablet 3   busPIRone (BUSPAR) 15 MG tablet TAKE 1 TABLET TWICE DAILY 180 tablet 1   citalopram (CELEXA) 40 MG tablet TAKE 1 TABLET EVERY DAY 90 tablet 10   LORazepam (ATIVAN) 0.5 MG tablet Pt to take 1/4 tablet to 1 tablet daily as needed. 30 tablet 2   metFORMIN (GLUCOPHAGE) 500 MG tablet TAKE 1 TABLET BY MOUTH EVERY DAY WITH BREAKFAST 90 tablet 1   metoprolol succinate (TOPROL-XL) 25 MG 24 hr tablet TAKE 1 TABLET (25 MG TOTAL) BY MOUTH DAILY. 90 tablet 10   mineral oil liquid Take by mouth at bedtime. Pt takes one tablespoon     nystatin ointment (MYCOSTATIN) Apply to affected area daily 30 g 0   Omega-3 Fatty Acids (FISH OIL PO)  Take 5 mLs by mouth daily.     No facility-administered medications prior to visit.   Past Medical History:  Diagnosis Date   Allergy    Anxiety disorder    Depression    History of chicken pox    Hyperlipidemia    Hypertension    Tobacco abuse    started at age 5 years   Past Surgical History:  Procedure Laterality Date   ABDOMINAL HYSTERECTOMY  1978   CARDIAC CATHETERIZATION     esohagus stretched     TONSILLECTOMY     Allergies  Allergen Reactions   Procaine Other (See Comments)    Hypotension, faint  Can tolerate mepivacaine   Ivp Dye [Iodinated Contrast Media] Hives    swelling   Sulfa Antibiotics Hives      Objective:    Physical Exam Vitals and nursing note reviewed.  Constitutional:      Appearance: Normal appearance. She is ill-appearing.     Interventions: Face mask in place.  HENT:     Right Ear: Tympanic membrane and ear canal normal.     Left Ear: Tympanic membrane and ear canal normal.     Nose:     Right Sinus: No frontal sinus tenderness.  Left Sinus: No frontal sinus tenderness.     Mouth/Throat:     Mouth: Mucous membranes are moist.     Pharynx: Posterior oropharyngeal erythema present. No pharyngeal swelling, oropharyngeal exudate or uvula swelling.     Tonsils: No tonsillar exudate or tonsillar abscesses.  Cardiovascular:     Rate and Rhythm: Normal rate and regular rhythm.  Pulmonary:     Effort: Pulmonary effort is normal.     Breath sounds: Normal breath sounds.  Musculoskeletal:        General: Normal range of motion.  Lymphadenopathy:     Head:     Right side of head: No preauricular or posterior auricular adenopathy.     Left side of head: No preauricular or posterior auricular adenopathy.     Cervical: No cervical adenopathy.  Skin:    General: Skin is warm and dry.  Neurological:     Mental Status: She is alert.  Psychiatric:        Mood and Affect: Mood normal.        Behavior: Behavior normal.    BP (!) 142/78  (BP Location: Left Arm, Patient Position: Sitting, Cuff Size: Large)   Pulse 82   Temp 100 F (37.8 C) (Temporal)   Ht 5\' 2"  (1.575 m)   Wt 161 lb (73 kg)   SpO2 94%   BMI 29.45 kg/m  Wt Readings from Last 3 Encounters:  02/04/22 161 lb (73 kg)  12/14/21 161 lb (73 kg)  08/30/21 164 lb 4 oz (74.5 kg)      09/01/21, NP

## 2022-02-05 ENCOUNTER — Other Ambulatory Visit (HOSPITAL_BASED_OUTPATIENT_CLINIC_OR_DEPARTMENT_OTHER): Payer: Medicare HMO

## 2022-03-01 ENCOUNTER — Other Ambulatory Visit: Payer: Self-pay | Admitting: Physician Assistant

## 2022-03-01 NOTE — Telephone Encounter (Signed)
She needs a rush on it because she is going out of town for 2 weeks. Please advise.

## 2022-03-01 NOTE — Telephone Encounter (Signed)
Last refill: 08/30/21 #30, 2 Last OV: 02/04/22 dx. COVID-19

## 2022-03-15 ENCOUNTER — Encounter: Payer: Self-pay | Admitting: Physician Assistant

## 2022-03-15 ENCOUNTER — Ambulatory Visit (INDEPENDENT_AMBULATORY_CARE_PROVIDER_SITE_OTHER): Payer: Medicare HMO | Admitting: Physician Assistant

## 2022-03-15 VITALS — BP 120/84 | HR 59 | Temp 97.5°F | Ht 62.0 in | Wt 160.0 lb

## 2022-03-15 DIAGNOSIS — F419 Anxiety disorder, unspecified: Secondary | ICD-10-CM

## 2022-03-15 DIAGNOSIS — Z720 Tobacco use: Secondary | ICD-10-CM

## 2022-03-15 DIAGNOSIS — F320 Major depressive disorder, single episode, mild: Secondary | ICD-10-CM

## 2022-03-15 DIAGNOSIS — E785 Hyperlipidemia, unspecified: Secondary | ICD-10-CM

## 2022-03-15 LAB — COMPREHENSIVE METABOLIC PANEL
ALT: 18 U/L (ref 0–35)
AST: 15 U/L (ref 0–37)
Albumin: 4.1 g/dL (ref 3.5–5.2)
Alkaline Phosphatase: 76 U/L (ref 39–117)
BUN: 10 mg/dL (ref 6–23)
CO2: 31 mEq/L (ref 19–32)
Calcium: 10.1 mg/dL (ref 8.4–10.5)
Chloride: 103 mEq/L (ref 96–112)
Creatinine, Ser: 0.88 mg/dL (ref 0.40–1.20)
GFR: 62.1 mL/min (ref >60.00)
Glucose, Bld: 121 mg/dL — ABNORMAL HIGH (ref 70–99)
Potassium: 5.1 mEq/L (ref 3.5–5.1)
Sodium: 142 mEq/L (ref 135–145)
Total Bilirubin: 0.6 mg/dL (ref 0.2–1.2)
Total Protein: 6.3 g/dL (ref 6.0–8.3)

## 2022-03-15 LAB — LIPID PANEL
Cholesterol: 108 mg/dL (ref 0–200)
HDL: 36.4 mg/dL — ABNORMAL LOW (ref >39.00)
LDL Cholesterol: 32 mg/dL (ref 0–99)
NonHDL: 71.76
Total CHOL/HDL Ratio: 3
Triglycerides: 200 mg/dL — ABNORMAL HIGH (ref 0.0–149.0)
VLDL: 40 mg/dL (ref 0.0–40.0)

## 2022-03-15 MED ORDER — LORAZEPAM 0.5 MG PO TABS: ORAL_TABLET | ORAL | 0 refills | Status: DC

## 2022-03-15 NOTE — Patient Instructions (Addendum)
It was great to see you!  I'm glad you are doing well  Please follow-up in 3 months  Call [facility] [phone]  to schedule your Calcium Score CT test    Take care,  Inda Coke PA-C

## 2022-03-15 NOTE — Progress Notes (Signed)
Sue Little is a 82 y.o. female here for a follow up of a pre-existing problem.  History of Present Illness:   Chief Complaint  Patient presents with   Hypertension   Hyperlipidemia    HPI  Hypertension Patients blood pressure is normal this visit. She is complaint with 25 mg Toprol-XL with no concerns. Denies chest pain, SOB.  Hyperlipidemia Patient is complaint with Lipitor 40 mg with no concerns every other day. She is not willing to take daily at this point. She had a calcium score scheduled but she couldn't have it done due to [place]. Does not feel like this has made her leg pain worse.  Anxiety and Depression Currently well controlled. She had a great holiday. Currently taking celexa 40 mg. Denies SI/HI. She is taking buspar 15 mg BID and also 1/4 to 1 tablet of ativan 0.5 mg daily. Denies falls.  Tobacco Abuse Continues to smoke. Not interested in quitting.  Past Medical History:  Diagnosis Date   Allergy    Anxiety disorder    Depression    History of chicken pox    Hyperlipidemia    Hypertension    Tobacco abuse    started at age 54 years     Social History   Tobacco Use   Smoking status: Every Day    Packs/day: 1.00    Types: Cigarettes    Start date: 03/11/1960   Smokeless tobacco: Never  Vaping Use   Vaping Use: Never used  Substance Use Topics   Alcohol use: Never   Drug use: Never    Past Surgical History:  Procedure Laterality Date   ABDOMINAL HYSTERECTOMY  1978   CARDIAC CATHETERIZATION     esohagus stretched     TONSILLECTOMY      Family History  Problem Relation Age of Onset   Diabetes Mother    Heart disease Mother    Coronary artery disease Mother    Dementia Mother    Alcohol abuse Father    Heart attack Father    Depression Sister    Osteoarthritis Sister    Depression Sister    Prostate cancer Brother    Parkinson's disease Brother    Prostate cancer Brother    Aneurysm Brother    CAD Brother        17 stents   Lung  cancer Maternal Grandfather     Allergies  Allergen Reactions   Procaine Other (See Comments)    Hypotension, faint  Can tolerate mepivacaine   Ivp Dye [Iodinated Contrast Media] Hives    swelling   Sulfa Antibiotics Hives    Current Medications:   Current Outpatient Medications:    aspirin EC 81 MG tablet, Take 81 mg by mouth daily. Swallow whole., Disp: , Rfl:    atorvastatin (LIPITOR) 40 MG tablet, TAKE 1 TABLET MY MOUTH EVERY OTHER DAY FOR CHOLESTEROL, Disp: 30 tablet, Rfl: 3   busPIRone (BUSPAR) 15 MG tablet, TAKE 1 TABLET TWICE DAILY, Disp: 180 tablet, Rfl: 1   citalopram (CELEXA) 40 MG tablet, TAKE 1 TABLET EVERY DAY, Disp: 90 tablet, Rfl: 10   metoprolol succinate (TOPROL-XL) 25 MG 24 hr tablet, TAKE 1 TABLET (25 MG TOTAL) BY MOUTH DAILY., Disp: 90 tablet, Rfl: 10   mineral oil liquid, Take by mouth at bedtime. Pt takes one tablespoon, Disp: , Rfl:    nystatin ointment (MYCOSTATIN), Apply to affected area daily, Disp: 30 g, Rfl: 0   LORazepam (ATIVAN) 0.5 MG tablet, TAKE 1/4 TABLET  TO 1 TABLET DAILY AS NEEDED., Disp: 90 tablet, Rfl: 0   Review of Systems:   ROS Negative unless otherwise specified per HPI.   Vitals:   Vitals:   03/15/22 1100  BP: 120/84  Pulse: (!) 59  Temp: (!) 97.5 F (36.4 C)  TempSrc: Temporal  SpO2: 97%  Weight: 160 lb (72.6 kg)  Height: 5\' 2"  (1.575 m)     Body mass index is 29.26 kg/m.  Physical Exam:   Physical Exam Constitutional:      General: She is not in acute distress.    Appearance: Normal appearance. She is not ill-appearing.  HENT:     Head: Normocephalic and atraumatic.     Right Ear: External ear normal.     Left Ear: External ear normal.  Eyes:     Extraocular Movements: Extraocular movements intact.     Pupils: Pupils are equal, round, and reactive to light.  Cardiovascular:     Rate and Rhythm: Normal rate and regular rhythm.     Heart sounds: Normal heart sounds. No murmur heard.    No gallop.  Pulmonary:      Effort: Pulmonary effort is normal. No respiratory distress.     Breath sounds: Normal breath sounds. No wheezing or rales.  Skin:    General: Skin is warm and dry.  Neurological:     Mental Status: She is alert and oriented to person, place, and time.  Psychiatric:        Judgment: Judgment normal.     Assessment and Plan:   Hyperlipidemia, unspecified hyperlipidemia type Update lipid panel Recommend that she start taking lipitor daily but she wants to take every other day for now Encouraged her to get calcium score done  Anxiety; Depression, major, single episode, mild (HCC) No red flags Stable Denies SI/HI Continue celexa 40 mg, buspar 15 mg BID and also 1/4 to 1 tablet of ativan 0.5 mg daily.  Denies falls or other concerns with benzo use PDMP reviewed, no red flags Follow-up in 3 months, sooner if concerns  Tobacco abuse Patient refuses to quit, not interested in discussing today  I,Verona Buck,acting as a scribe for [employer], PA.,have documented all relevant documentation on the behalf of Inda Coke, PA,as directed by  Inda Coke, PA while in the presence of Inda Coke, [place].  I, Inda Coke, [place], have reviewed all documentation for this visit. The documentation on 03/15/22 for the exam, diagnosis, procedures, and orders are all accurate and complete.  Inda Coke, PA-C

## 2022-05-23 ENCOUNTER — Telehealth: Payer: Self-pay | Admitting: Physician Assistant

## 2022-05-23 NOTE — Telephone Encounter (Signed)
Contacted Sue Little to schedule their annual wellness visit. Appointment made for 05/30/2022.  [facility] Dial [phone]

## 2022-05-29 ENCOUNTER — Other Ambulatory Visit: Payer: Self-pay | Admitting: Physician Assistant

## 2022-05-30 ENCOUNTER — Ambulatory Visit (INDEPENDENT_AMBULATORY_CARE_PROVIDER_SITE_OTHER): Payer: Medicare HMO

## 2022-05-30 VITALS — Wt 160.0 lb

## 2022-05-30 DIAGNOSIS — Z0000 Encounter for general adult medical examination without abnormal findings: Secondary | ICD-10-CM | POA: Diagnosis not present

## 2022-05-30 NOTE — Progress Notes (Signed)
I connected with  Sue Little on 05/30/22 by a audio enabled telemedicine application and verified that I am speaking with the correct person using two identifiers.  Patient Location: Home  Provider Location: Office/Clinic  I discussed the limitations of evaluation and management by telemedicine. The patient expressed understanding and agreed to proceed.   Subjective:   Sue Little is a 82 y.o. female who presents for Medicare Annual (Subsequent) preventive examination.  Review of Systems     Cardiac Risk Factors include: advanced age (>33men, >25 women);dyslipidemia;hypertension;smoking/ tobacco exposure     Objective:    Today's Vitals   05/30/22 1514  Weight: 160 lb (72.6 kg)   Body mass index is 29.26 kg/m.     05/30/2022    3:33 PM 05/28/2021   10:24 AM 07/13/2020   10:15 AM  Advanced Directives  Does Patient Have a Medical Advance Directive? Yes Yes Yes  Type of Paramedic of [place];Living will Healthcare Power of [place];Living will  Does patient want to make changes to medical advance directive?   No - Patient declined  Copy of [place] in Chart? No - copy requested No - copy requested     Current Medications (verified) Outpatient Encounter Medications as of 05/30/2022  Medication Sig   aspirin EC 81 MG tablet Take 81 mg by mouth daily. Swallow whole.   atorvastatin (LIPITOR) 40 MG tablet TAKE 1 TABLET MY MOUTH EVERY OTHER DAY FOR CHOLESTEROL   busPIRone (BUSPAR) 15 MG tablet TAKE 1 TABLET TWICE DAILY   citalopram (CELEXA) 40 MG tablet TAKE 1 TABLET EVERY DAY   docusate sodium (COLACE) 50 MG capsule Take 50 mg by mouth 2 (two) times daily.   LORazepam (ATIVAN) 0.5 MG tablet TAKE 1/4 TABLET TO 1 TABLET DAILY AS NEEDED.   metoprolol succinate (TOPROL-XL) 25 MG 24 hr tablet TAKE 1 TABLET (25 MG TOTAL) BY MOUTH DAILY.   nystatin ointment (MYCOSTATIN) Apply to affected area daily    [DISCONTINUED] mineral oil liquid Take by mouth at bedtime. Pt takes one tablespoon   No facility-administered encounter medications on file as of 05/30/2022.    Allergies (verified) Procaine, Ivp dye [iodinated contrast media], and Sulfa antibiotics   History: Past Medical History:  Diagnosis Date   Allergy    Anxiety disorder    Depression    History of chicken pox    Hyperlipidemia    Hypertension    Tobacco abuse    started at age 38 years   Past Surgical History:  Procedure Laterality Date   ABDOMINAL HYSTERECTOMY  1978   CARDIAC CATHETERIZATION     esohagus stretched     TONSILLECTOMY     Family History  Problem Relation Age of Onset   Diabetes Mother    Heart disease Mother    Coronary artery disease Mother    Dementia Mother    Alcohol abuse Father    Heart attack Father    Depression Sister    Osteoarthritis Sister    Depression Sister    Prostate cancer Brother    Parkinson's disease Brother    Prostate cancer Brother    Aneurysm Brother    CAD Brother        58 stents   Lung cancer Maternal Grandfather    Social History   Socioeconomic History   Marital status: Widowed    Spouse name: Not on file   Number of children: Not on file  Years of education: Not on file   Highest education level: Not on file  Occupational History   Not on file  Tobacco Use   Smoking status: Every Day    Packs/day: 1    Types: Cigarettes    Start date: 03/11/1960   Smokeless tobacco: Never  Vaping Use   Vaping Use: Never used  Substance and Sexual Activity   Alcohol use: Never   Drug use: Never   Sexual activity: Not on file  Other Topics Concern   Not on file  Social History Narrative   Moved from [place], [place] to [place] in Oct 2020   Widowed in 2009   3 children   6 grandchildren   Retired Charity fundraiser: reading, sleep, 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, Chief Strategy Officer   Social Determinants of Health   Financial Resource Strain: Low Risk  (05/30/2022)    Overall Financial Resource Strain (CARDIA)    Difficulty of Paying Living Expenses: Not hard at all  Food Insecurity: No Food Insecurity (05/30/2022)   Hunger Vital Sign    Worried About Running Out of Food in the Last Year: Never true    Ran Out of Food in the Last Year: Never true  Transportation Needs: No Transportation Needs (05/30/2022)   PRAPARE - Hydrologist (Medical): No    Lack of Transportation (Non-Medical): No  Physical Activity: Insufficiently Active (05/30/2022)   Exercise Vital Sign    Days of Exercise per Week: 7 days    Minutes of Exercise per Session: 10 min  Stress: No Stress Concern Present (05/30/2022)   [place]    Feeling of Stress : Not at all  Social Connections: Socially Isolated (05/30/2022)   Social Connection and Isolation Panel [NHANES]    Frequency of Communication with Friends and Family: More than three times a week    Frequency of Social Gatherings with Friends and Family: More than three times a week    Attends Religious Services: Never    Marine scientist or Organizations: No    Attends Archivist Meetings: Never    Marital Status: Widowed    Tobacco Counseling Ready to quit: Not Answered Counseling given: Not Answered   Clinical Intake:  Pre-visit preparation completed: Yes  Pain : No/denies pain     BMI - recorded: 29.26 Nutritional Status: BMI 25 -29 Overweight Nutritional Risks: None Diabetes: No  How often do you need to have someone help you when you read instructions, pamphlets, or other written materials from your doctor or pharmacy?: 1 - Never  Diabetic?no  Interpreter Needed?: No  Information entered by :: Charlott Rakes, LPN   Activities of Daily Living    05/30/2022    3:34 PM  In your present state of health, do you have any difficulty performing the following activities:  Hearing? 1  Comment slight loss   Vision? 0  Difficulty concentrating or making decisions? 0  Walking or climbing stairs? 0  Dressing or bathing? 0  Doing errands, shopping? 0  Preparing Food and eating ? N  Using the Toilet? N  In the past six months, have you accidently leaked urine? Y  Comment wears pads  Do you have problems with loss of bowel control? N  Managing your Medications? N  Managing your Finances? N  Housekeeping or managing your Housekeeping? N    Patient Care Team: Inda Coke, [place] as PCP - General (  Physician Assistant)  Indicate any recent Medical Services you may have received from other than [facility] providers in the past year (date may be approximate).     Assessment:   This is a routine wellness examination for Aarya.  Hearing/Vision screen Hearing Screening - Comments:: Pt stated slight hearing loss  Vision Screening - Comments:: Pt follows up with [facility]  Dietary issues and exercise activities discussed: Current Exercise Habits: Home exercise routine, Type of exercise: walking, Time (Minutes): 10, Frequency (Times/Week): 7, Weekly Exercise (Minutes/Week): 70   Goals Addressed             This Visit's Progress    Patient Stated       Stay healthy        Depression Screen    05/30/2022    3:30 PM 03/15/2022   11:09 AM 12/14/2021   11:13 AM 08/30/2021   11:10 AM 05/28/2021   10:22 AM 05/01/2021    1:35 PM 03/21/2021    2:32 PM  PHQ 2/9 Scores  PHQ - 2 Score 0 0 6 2 1 2 3   PHQ- 9 Score  2 19 6  7 17     Fall Risk    05/30/2022    3:34 PM 05/28/2021   10:26 AM  Fall Risk   Falls in the past year? 0 0  Number falls in past yr: 0 0  Injury with Fall? 0 0  Risk for fall due to : Impaired mobility;Impaired balance/gait;Impaired vision Impaired vision;Impaired balance/gait  Follow up Falls prevention discussed Falls prevention discussed    FALL RISK PREVENTION PERTAINING TO THE HOME:  Any stairs in or around the home? No  If so, are there any without handrails? No   Home free of loose throw rugs in walkways, pet beds, electrical cords, etc? Yes  Adequate lighting in your home to reduce risk of falls? Yes   ASSISTIVE DEVICES UTILIZED TO PREVENT FALLS:  Life alert? No  Use of a cane, walker or w/c? No  Grab bars in the bathroom? Yes  Shower chair or bench in shower? No  Elevated toilet seat or a handicapped toilet? No   TIMED UP AND GO:  Was the test performed? No .  Cognitive Function:        05/30/2022    3:35 PM 05/28/2021   10:28 AM  6CIT Screen  What Year? 0 points 0 points  What month? 0 points 0 points  What time? 0 points 0 points  Count back from 20 0 points 0 points  Months in reverse 0 points 0 points  Repeat phrase 0 points 4 points  Total Score 0 points 4 points    Immunizations Immunization History  Administered Date(s) Administered   Moderna Sars-Covid-2 Vaccination 03/23/2019, 04/20/2019, 04/14/2020   Pneumococcal Conjugate-13 01/02/2018   Pneumococcal Polysaccharide-23 10/23/2007   Td 12/07/2019    TDAP status: Up to date  Flu Vaccine status: Declined, Education has been provided regarding the importance of this vaccine but patient still declined. Advised may receive this vaccine at local pharmacy or Health Dept. Aware to provide a copy of the vaccination record if obtained from local pharmacy or Health Dept. Verbalized acceptance and understanding.  Pneumococcal vaccine status: Up to date  Covid-19 vaccine status: Completed vaccines  Qualifies for Shingles Vaccine? Yes   Zostavax completed No   Shingrix Completed?: No.    Education has been provided regarding the importance of this vaccine. Patient has been advised to call insurance company to determine  out of pocket expense if they have not yet received this vaccine. Advised may also receive vaccine at local pharmacy or Health Dept. Verbalized acceptance and understanding.  Screening Tests Health Maintenance  Topic Date Due   Lung Cancer Screening  Never  done   DEXA SCAN  Never done   Zoster Vaccines- Shingrix (1 of 2) 08/31/2022 (Originally 11/11/1991)   COVID-19 Vaccine (4 - 2023-24 season) 03/16/2023 (Originally 11/09/2021)   Medicare Annual Wellness (AWV)  05/30/2023   DTaP/Tdap/Td (2 - Tdap) 12/06/2029   Pneumonia Vaccine 72+ Years old  Completed   HPV VACCINES  Aged Out   INFLUENZA VACCINE  Discontinued    Health Maintenance  Health Maintenance Due  Topic Date Due   Lung Cancer Screening  Never done   DEXA SCAN  Never done    Colorectal cancer screening: No longer required.   Mammogram status: Completed 04/23/21. Repeat every year    Lung Cancer Screening: (Low Dose CT Chest recommended if Age 62-80 years, 30 pack-year currently smoking OR have quit w/in 15years.) does qualify.   Lung Cancer Screening Referral: pt declined at this time   Additional Screening:  Vision Screening: Recommended annual ophthalmology exams for early detection of glaucoma and other disorders of the eye. Is the patient up to date with their annual eye exam?  No  Who is the provider or what is the name of the office in which the patient attends annual eye exams? [facility]  If pt is not established with a provider, would they like to be referred to a provider to establish care? No .   Dental Screening: Recommended annual dental exams for proper oral hygiene  Community Resource Referral / Chronic Care Management: CRR required this visit?  No   CCM required this visit?  No      Plan:     I have personally reviewed and noted the following in the patient's chart:   Medical and social history Use of alcohol, tobacco or illicit drugs  Current medications and supplements including opioid prescriptions. Patient is not currently taking opioid prescriptions. Functional ability and status Nutritional status Physical activity Advanced directives List of other physicians Hospitalizations, surgeries, and ER visits in previous 12  months Vitals Screenings to include cognitive, depression, and falls Referrals and appointments  In addition, I have reviewed and discussed with patient certain preventive protocols, quality metrics, and best practice recommendations. A written personalized care plan for preventive services as well as general preventive health recommendations were provided to patient.     Willette Brace, LPN   [id number]   Nurse Notes: none

## 2022-05-30 NOTE — Patient Instructions (Signed)
[name] , Thank you for taking time to come for your Medicare Wellness Visit. I appreciate your ongoing commitment to your health goals. Please review the following plan we discussed and let me know if I can assist you in the future.   These are the goals we discussed:  Goals      Patient Stated     None at this time      Quit Smoking        This is a list of the screening recommended for you and due dates:  Health Maintenance  Topic Date Due   Screening for Lung Cancer  Never done   DEXA scan (bone density measurement)  Never done   Zoster (Shingles) Vaccine (1 of 2) 08/31/2022*   COVID-19 Vaccine (4 - 2023-24 season) 03/16/2023*   Medicare Annual Wellness Visit  05/30/2023   DTaP/Tdap/Td vaccine (2 - Tdap) 12/06/2029   Pneumonia Vaccine  Completed   HPV Vaccine  Aged Out   Flu Shot  Discontinued  *Topic was postponed. The date shown is not the original due date.    Advanced directives: Please bring a copy of your health care power of attorney and living will to the office at your convenience.  Conditions/risks identified: stay healthy   Next appointment: Follow up in one year for your annual wellness visit    Preventive Care 65 Years and Older, Female Preventive care refers to lifestyle choices and visits with your health care provider that can promote health and wellness. What does preventive care include? A yearly physical exam. This is also called an annual well check. Dental exams once or twice a year. Routine eye exams. Ask your health care provider how often you should have your eyes checked. Personal lifestyle choices, including: Daily care of your teeth and gums. Regular physical activity. Eating a healthy diet. Avoiding tobacco and drug use. Limiting alcohol use. Practicing safe sex. Taking low-dose aspirin every day. Taking vitamin and mineral supplements as recommended by your health care provider. What happens during an annual well check? The services  and screenings done by your health care provider during your annual well check will depend on your age, overall health, lifestyle risk factors, and family history of disease. Counseling  Your health care provider may ask you questions about your: Alcohol use. Tobacco use. Drug use. Emotional well-being. Home and relationship well-being. Sexual activity. Eating habits. History of falls. Memory and ability to understand (cognition). Work and work Statistician. Reproductive health. Screening  You may have the following tests or measurements: Height, weight, and BMI. Blood pressure. Lipid and cholesterol levels. These may be checked every 5 years, or more frequently if you are over 106 years old. Skin check. Lung cancer screening. You may have this screening every year starting at age 74 if you have a 30-pack-year history of smoking and currently smoke or have quit within the past 15 years. Fecal occult blood test (FOBT) of the stool. You may have this test every year starting at age 85. Flexible sigmoidoscopy or colonoscopy. You may have a sigmoidoscopy every 5 years or a colonoscopy every 10 years starting at age 68. Hepatitis C blood test. Hepatitis B blood test. Sexually transmitted disease (STD) testing. Diabetes screening. This is done by checking your blood sugar (glucose) after you have not eaten for a while (fasting). You may have this done every 1-3 years. Bone density scan. This is done to screen for osteoporosis. You may have this done starting at age 54.  Mammogram. This may be done every 1-2 years. Talk to your health care provider about how often you should have regular mammograms. Talk with your health care provider about your test results, treatment options, and if necessary, the need for more tests. Vaccines  Your health care provider may recommend certain vaccines, such as: Influenza vaccine. This is recommended every year. Tetanus, diphtheria, and acellular pertussis  (Tdap, Td) vaccine. You may need a Td booster every 10 years. Zoster vaccine. You may need this after age 49. Pneumococcal 13-valent conjugate (PCV13) vaccine. One dose is recommended after age 85. Pneumococcal polysaccharide (PPSV23) vaccine. One dose is recommended after age 19. Talk to your health care provider about which screenings and vaccines you need and how often you need them. This information is not intended to replace advice given to you by your health care provider. Make sure you discuss any questions you have with your health care provider. Document Released: 03/24/2015 Document Revised: 11/15/2015 Document Reviewed: 12/27/2014 Elsevier Interactive Patient Education  2017 [place] Prevention in the Home Falls can cause injuries. They can happen to people of all ages. There are many things you can do to make your home safe and to help prevent falls. What can I do on the outside of my home? Regularly fix the edges of walkways and driveways and fix any cracks. Remove anything that might make you trip as you walk through a door, such as a raised step or threshold. Trim any bushes or trees on the path to your home. Use bright outdoor lighting. Clear any walking paths of anything that might make someone trip, such as rocks or tools. Regularly check to see if handrails are loose or broken. Make sure that both sides of any steps have handrails. Any raised decks and porches should have guardrails on the edges. Have any leaves, snow, or ice cleared regularly. Use sand or salt on walking paths during winter. Clean up any spills in your garage right away. This includes oil or grease spills. What can I do in the bathroom? Use night lights. Install grab bars by the toilet and in the tub and shower. Do not use towel bars as grab bars. Use non-skid mats or decals in the tub or shower. If you need to sit down in the shower, use a plastic, non-slip stool. Keep the floor dry. Clean  up any water that spills on the floor as soon as it happens. Remove soap buildup in the tub or shower regularly. Attach bath mats securely with double-sided non-slip rug tape. Do not have throw rugs and other things on the floor that can make you trip. What can I do in the bedroom? Use night lights. Make sure that you have a light by your bed that is easy to reach. Do not use any sheets or blankets that are too big for your bed. They should not hang down onto the floor. Have a firm chair that has side arms. You can use this for support while you get dressed. Do not have throw rugs and other things on the floor that can make you trip. What can I do in the kitchen? Clean up any spills right away. Avoid walking on wet floors. Keep items that you use a lot in easy-to-reach places. If you need to reach something above you, use a strong step stool that has a grab bar. Keep electrical cords out of the way. Do not use floor polish or wax that makes floors slippery. If  you must use wax, use non-skid floor wax. Do not have throw rugs and other things on the floor that can make you trip. What can I do with my stairs? Do not leave any items on the stairs. Make sure that there are handrails on both sides of the stairs and use them. Fix handrails that are broken or loose. Make sure that handrails are as long as the stairways. Check any carpeting to make sure that it is firmly attached to the stairs. Fix any carpet that is loose or worn. Avoid having throw rugs at the top or bottom of the stairs. If you do have throw rugs, attach them to the floor with carpet tape. Make sure that you have a light switch at the top of the stairs and the bottom of the stairs. If you do not have them, ask someone to add them for you. What else can I do to help prevent falls? Wear shoes that: Do not have high heels. Have rubber bottoms. Are comfortable and fit you well. Are closed at the toe. Do not wear sandals. If you  use a stepladder: Make sure that it is fully opened. Do not climb a closed stepladder. Make sure that both sides of the stepladder are locked into place. Ask someone to hold it for you, if possible. Clearly mark and make sure that you can see: Any grab bars or handrails. First and last steps. Where the edge of each step is. Use tools that help you move around (mobility aids) if they are needed. These include: Canes. Walkers. Scooters. Crutches. Turn on the lights when you go into a dark area. Replace any light bulbs as soon as they burn out. Set up your furniture so you have a clear path. Avoid moving your furniture around. If any of your floors are uneven, fix them. If there are any pets around you, be aware of where they are. Review your medicines with your doctor. Some medicines can make you feel dizzy. This can increase your chance of falling. Ask your doctor what other things that you can do to help prevent falls. This information is not intended to replace advice given to you by your health care provider. Make sure you discuss any questions you have with your health care provider. Document Released: 12/22/2008 Document Revised: 08/03/2015 Document Reviewed: 04/01/2014 Elsevier Interactive Patient Education  2017 [employer].

## 2022-06-25 ENCOUNTER — Ambulatory Visit (INDEPENDENT_AMBULATORY_CARE_PROVIDER_SITE_OTHER): Payer: Medicare HMO | Admitting: Physician Assistant

## 2022-06-25 ENCOUNTER — Encounter: Payer: Self-pay | Admitting: Physician Assistant

## 2022-06-25 VITALS — BP 120/78 | HR 62 | Temp 98.2°F | Resp 16 | Ht 62.0 in | Wt 160.2 lb

## 2022-06-25 DIAGNOSIS — K1379 Other lesions of oral mucosa: Secondary | ICD-10-CM | POA: Diagnosis not present

## 2022-06-25 DIAGNOSIS — F419 Anxiety disorder, unspecified: Secondary | ICD-10-CM | POA: Diagnosis not present

## 2022-06-25 DIAGNOSIS — I739 Peripheral vascular disease, unspecified: Secondary | ICD-10-CM

## 2022-06-25 DIAGNOSIS — L600 Ingrowing nail: Secondary | ICD-10-CM

## 2022-06-25 DIAGNOSIS — R7303 Prediabetes: Secondary | ICD-10-CM | POA: Diagnosis not present

## 2022-06-25 DIAGNOSIS — F32A Depression, unspecified: Secondary | ICD-10-CM

## 2022-06-25 DIAGNOSIS — R413 Other amnesia: Secondary | ICD-10-CM | POA: Diagnosis not present

## 2022-06-25 DIAGNOSIS — L989 Disorder of the skin and subcutaneous tissue, unspecified: Secondary | ICD-10-CM

## 2022-06-25 DIAGNOSIS — E785 Hyperlipidemia, unspecified: Secondary | ICD-10-CM

## 2022-06-25 MED ORDER — LORAZEPAM 0.5 MG PO TABS: ORAL_TABLET | ORAL | 0 refills | Status: DC

## 2022-06-25 NOTE — Patient Instructions (Addendum)
It was great to see you!  Let me know if you would like a referral to the foot doctor for your toe  I would like to place referral to the Vascular surgeon   I would like to place referral to Neuropsychologist  I would like to place referral to Social Work to see if they can help you with transportation and a more accessible therapist  I would like to place referral to Dermatology  Follow-up in 3 months, sooner if concerns  Take care,  Jarold Motto PA-C

## 2022-06-25 NOTE — Progress Notes (Signed)
Sue Little is a 82 y.o. female here for a follow up of a pre-existing problem.  History of Present Illness:   Chief Complaint  Patient presents with   Medical Management of Chronic Issues    PAD f/u   Toe Pain    Ingrown toe nail on left big toe   oral abcess    Roof of her mouth    Nevus    On her nose, wanting Makell Cyr to freeze it off    HPI  Anxiety/Depression: She reports she has been feeling very overwhelmed and sometimes can't function well.  She states she has not had much of an appetite at dinner time.  She is complaint with 40 mg Celexa, 15 mg Buspar, and Lorazepam. When she feels overwhelmed she sometimes resorts to taking a nap.  She is interested in talk therapy.  Has limited transportation and would like to be evaluated at her home and perform home in-person talk therapy if agreeable. She is also having issues with her memory at times and has difficulty word finding  PAD: She complains of chronic leg pain.  She previously followed up with Dr. Darrick Penna.  Has been lost to follow-up.  Ingrown Toe Nail: She reports having an ingrown nail on her left great toe. She tried to take out the ingrown nail herself but it has not helped.   Oral abscess:  She reports having an abscess at the roof of her mouth.  She endorses a throbbing pain.  She has been taking Ibuprofen to relieve pain.   Constipation She reports that her bowel movements have been irregular.  Taking Colace seems to be helping her.  She endorses having blood in stool whenever she has a "bad bowel movement". She is not interested in any cancer screenings.   HLD Taking lipitor 40 mg every other day and daily 81 mg ASA  Pre-diabetes Currently not on medication Agreeable to rechecking her A1c   Past Medical History:  Diagnosis Date   Allergy    Anxiety disorder    Depression    History of chicken pox    Hyperlipidemia    Hypertension    Tobacco abuse    started at age 20 years      Social History   Tobacco Use   Smoking status: Every Day    Packs/day: 1    Types: Cigarettes    Start date: 03/11/1960   Smokeless tobacco: Never  Vaping Use   Vaping Use: Never used  Substance Use Topics   Alcohol use: Never   Drug use: Never    Past Surgical History:  Procedure Laterality Date   ABDOMINAL HYSTERECTOMY  1978   CARDIAC CATHETERIZATION     esohagus stretched     TONSILLECTOMY      Family History  Problem Relation Age of Onset   Diabetes Mother    Heart disease Mother    Coronary artery disease Mother    Dementia Mother    Alcohol abuse Father    Heart attack Father    Depression Sister    Osteoarthritis Sister    Depression Sister    Prostate cancer Brother    Parkinson's disease Brother    Prostate cancer Brother    Aneurysm Brother    CAD Brother        17 stents   Lung cancer Maternal Grandfather     Allergies  Allergen Reactions   Procaine Other (See Comments)    Hypotension, faint  Can tolerate  mepivacaine   Ivp Dye [Iodinated Contrast Media] Hives    swelling   Sulfa Antibiotics Hives    Current Medications:   Current Outpatient Medications:    aspirin EC 81 MG tablet, Take 81 mg by mouth daily. Swallow whole., Disp: , Rfl:    atorvastatin (LIPITOR) 40 MG tablet, TAKE 1 TABLET MY MOUTH EVERY OTHER DAY FOR CHOLESTEROL, Disp: 30 tablet, Rfl: 3   busPIRone (BUSPAR) 15 MG tablet, TAKE 1 TABLET TWICE DAILY, Disp: 180 tablet, Rfl: 3   citalopram (CELEXA) 40 MG tablet, TAKE 1 TABLET EVERY DAY, Disp: 90 tablet, Rfl: 10   docusate sodium (COLACE) 50 MG capsule, Take 50 mg by mouth 2 (two) times daily., Disp: , Rfl:    LORazepam (ATIVAN) 0.5 MG tablet, TAKE 1/4 TABLET TO 1 TABLET DAILY AS NEEDED., Disp: 90 tablet, Rfl: 0   metoprolol succinate (TOPROL-XL) 25 MG 24 hr tablet, TAKE 1 TABLET (25 MG TOTAL) BY MOUTH DAILY., Disp: 90 tablet, Rfl: 10   nystatin ointment (MYCOSTATIN), Apply to affected area daily, Disp: 30 g, Rfl: 0   Review of  Systems:   Review of Systems  Gastrointestinal:  Positive for blood in stool.  Musculoskeletal:  Positive for myalgias (leg pain).  Psychiatric/Behavioral:  The patient is nervous/anxious.     Vitals:   Vitals:   06/25/22 1334  BP: 120/78  Pulse: 62  Resp: 16  Temp: 98.2 F (36.8 C)  TempSrc: Temporal  SpO2: 98%  Weight: 160 lb 3.2 oz (72.7 kg)  Height:  (1.575 m)     Body mass index is 29.3 kg/m.  Physical Exam:   Physical Exam Vitals and nursing note reviewed.  Constitutional:      General: She is not in acute distress.    Appearance: She is well-developed. She is not ill-appearing or toxic-appearing.  HENT:     Mouth/Throat:     Comments: Slight area of redness to roof of mouth - no distinct lesion appreciated Cardiovascular:     Rate and Rhythm: Normal rate and regular rhythm.     Pulses: Normal pulses.     Heart sounds: Normal heart sounds, S1 normal and S2 normal.  Pulmonary:     Effort: Pulmonary effort is normal.     Breath sounds: Normal breath sounds.  Musculoskeletal:     Comments: Slightly erythematous lateral left great toe nail fold with slight TTP; no induration or fluctuance  Skin:    General: Skin is warm and dry.     Comments: Scattered hyperpigmented lesions to face  Neurological:     Mental Status: She is alert.     GCS: GCS eye subscore is 4. GCS verbal subscore is 5. GCS motor subscore is 6.  Psychiatric:        Speech: Speech normal.        Behavior: Behavior normal. Behavior is cooperative.     Assessment and Plan:   Anxiety and depression; Memory deficits Uncontrolled Continue 40 mg Celexa, 15 mg Buspar, and Lorazepam Referral to neurocognitive testing Consider psychiatry referral Community care referral placed for help with finding transportation and LCSW Follow-up in 3 months, sooner if controlled Denies SI/HI  Ingrown toenail of left foot No red flags Do not feel she needs abx Recommend podiatry referral - she  declines and will reach out if she wants this  PAD (peripheral artery disease) Referral to vein and vascular to re-connect with their care  Skin lesions Referral to dermatology  Mouth pain No suspicious  lesions appreciated Continue to monitor Recommend follow-up with dentist  Hyperlipidemia, unspecified hyperlipidemia type Update lipid panel and make recommendations accordingly She will pursue calcium score when financially able to Will adjust lipitor 40 mg thrice weekly as indicated  Prediabetes Update A1c and provide recommendations accordingly  I,Rachel Rivera,acting as a scribe for Jarold Motto, PA.,have documented all relevant documentation on the behalf of Jarold Motto, PA,as directed by  Jarold Motto, PA while in the presence of Jarold Motto, [place].  I, Jarold Motto, [place], have reviewed all documentation for this visit. The documentation on 06/25/22 for the exam, diagnosis, procedures, and orders are all accurate and complete.  I spent a total of 52 minutes on this visit, today 06/25/22, which included reviewing previous notes from Care Coordinator on 04/2021, Vascular on 07/13/20, ordering tests, discussing plan of care with patient and using shared-decision making on next steps, refilling medications, and documenting the findings in the note.  Jarold Motto, PA-C

## 2022-06-26 LAB — LIPID PANEL
Cholesterol: 115 mg/dL (ref 0–200)
HDL: 38.3 mg/dL — ABNORMAL LOW (ref >39.00)
NonHDL: 76.72
Total CHOL/HDL Ratio: 3
Triglycerides: 247 mg/dL — ABNORMAL HIGH (ref 0.0–149.0)
VLDL: 49.4 mg/dL — ABNORMAL HIGH (ref 0.0–40.0)

## 2022-06-26 LAB — CBC WITH DIFFERENTIAL/PLATELET
Basophils Absolute: 0.1 10*3/uL (ref 0.0–0.1)
Basophils Relative: 0.8 % (ref 0.0–3.0)
Eosinophils Absolute: 0.3 10*3/uL (ref 0.0–0.7)
Eosinophils Relative: 2.5 % (ref 0.0–5.0)
HCT: 42.4 % (ref 36.0–46.0)
Hemoglobin: 13.9 g/dL (ref 12.0–15.0)
Lymphocytes Relative: 36.5 % (ref 12.0–46.0)
Lymphs Abs: 4.1 10*3/uL — ABNORMAL HIGH (ref 0.7–4.0)
MCHC: 32.8 g/dL (ref 30.0–36.0)
MCV: 86.7 fl (ref 78.0–100.0)
Monocytes Absolute: 0.6 10*3/uL (ref 0.1–1.0)
Monocytes Relative: 5.7 % (ref 3.0–12.0)
Neutro Abs: 6.2 10*3/uL (ref 1.4–7.7)
Neutrophils Relative %: 54.5 % (ref 43.0–77.0)
Platelets: 249 10*3/uL (ref 150.0–400.0)
RBC: 4.89 Mil/uL (ref 3.87–5.11)
RDW: 13.3 % (ref 11.5–15.5)
WBC: 11.3 10*3/uL — ABNORMAL HIGH (ref 4.0–10.5)

## 2022-06-26 LAB — COMPREHENSIVE METABOLIC PANEL
ALT: 23 U/L (ref 0–35)
AST: 18 U/L (ref 0–37)
Albumin: 4.2 g/dL (ref 3.5–5.2)
Alkaline Phosphatase: 79 U/L (ref 39–117)
BUN: 10 mg/dL (ref 6–23)
CO2: 30 mEq/L (ref 19–32)
Calcium: 9.8 mg/dL (ref 8.4–10.5)
Chloride: 103 mEq/L (ref 96–112)
Creatinine, Ser: 0.96 mg/dL (ref 0.40–1.20)
GFR: 55.83 mL/min — ABNORMAL LOW (ref >60.00)
Glucose, Bld: 131 mg/dL — ABNORMAL HIGH (ref 70–99)
Potassium: 4.5 mEq/L (ref 3.5–5.1)
Sodium: 141 mEq/L (ref 135–145)
Total Bilirubin: 0.5 mg/dL (ref 0.2–1.2)
Total Protein: 6.6 g/dL (ref 6.0–8.3)

## 2022-06-26 LAB — TSH: TSH: 1.71 u[IU]/mL (ref 0.35–5.50)

## 2022-06-26 LAB — LDL CHOLESTEROL, DIRECT: Direct LDL: 52 mg/dL

## 2022-06-26 LAB — HEMOGLOBIN A1C: Hgb A1c MFr Bld: 7.2 % — ABNORMAL HIGH (ref 4.6–6.5)

## 2022-06-26 LAB — VITAMIN B12: Vitamin B-12: 220 pg/mL (ref 211–911)

## 2022-06-27 ENCOUNTER — Telehealth: Payer: Self-pay | Admitting: *Deleted

## 2022-06-27 NOTE — Telephone Encounter (Signed)
   Telephone encounter was:  Unsuccessful.  06/27/2022 Name: Sue Little MRN: [id number] DOB: 1940-08-26  Unsuccessful outbound call made today to assist with:  Transportation Needs   Outreach Attempt:  1st Attempt  A HIPAA compliant voice message was left requesting a return call.  Instructed patient to call back at [phone].  Yehuda Mao [facility] -[facility] [place], Population Health [phone] [address] [place] , [place] [address] Email : [email or web address]

## 2022-07-02 ENCOUNTER — Encounter: Payer: Self-pay | Admitting: Physician Assistant

## 2022-07-02 ENCOUNTER — Ambulatory Visit (INDEPENDENT_AMBULATORY_CARE_PROVIDER_SITE_OTHER): Payer: Medicare HMO | Admitting: Physician Assistant

## 2022-07-02 ENCOUNTER — Telehealth: Payer: Self-pay | Admitting: *Deleted

## 2022-07-02 VITALS — BP 130/76 | HR 58 | Temp 97.5°F | Ht 62.0 in | Wt 159.4 lb

## 2022-07-02 DIAGNOSIS — E119 Type 2 diabetes mellitus without complications: Secondary | ICD-10-CM | POA: Diagnosis not present

## 2022-07-02 DIAGNOSIS — R519 Headache, unspecified: Secondary | ICD-10-CM | POA: Diagnosis not present

## 2022-07-02 DIAGNOSIS — E538 Deficiency of other specified B group vitamins: Secondary | ICD-10-CM

## 2022-07-02 MED ORDER — CYANOCOBALAMIN 1000 MCG/ML IJ SOLN
1000.0000 ug | Freq: Once | INTRAMUSCULAR | Status: AC
Start: 2022-07-02 — End: 2022-07-02
  Administered 2022-07-02: 1000 ug via INTRAMUSCULAR

## 2022-07-02 NOTE — Patient Instructions (Signed)
It was great to see you!  Try to consider a sugar-free creamer Stop drinking so much sweetened tea Symptoms of elevated blood sugar Consider sugar-free chocolate syrup Choose more protein for breakfast  Start oral 500 mcg B-12  Follow-up in 4 months to recheck blood sugars  Take care,  Jarold Motto PA-C

## 2022-07-02 NOTE — Telephone Encounter (Signed)
   Telephone encounter was:  Unsuccessful.  07/02/2022 Name: Sue Little MRN: [id number] DOB: Apr 11, 1940  Unsuccessful outbound call made today to assist with:  Transportation Needs   Outreach Attempt:  2nd Attempt  A HIPAA compliant voice message was left requesting a return call.  Instructed patient to call back at [phone].  Yehuda Mao Greenauer -[facility] [place], Population Health [phone] [address] [place] , [place] [address] Email : [email or web address]

## 2022-07-02 NOTE — Progress Notes (Signed)
Sue Little is a 82 y.o. female here for a new problem.  History of Present Illness:   Chief Complaint  Patient presents with   Lab  results    Pt is here to discuss lab results from 06/25/2022.    HPI  Diabetes: We reviewed the results of her recent lab work from 4/16.  Her glucose level was 131 and her A1C was 7.2.  She drinks coffee with about 2 tablespoons of coconut creamer. She also tends to drinks sweet tea and about 4-6 oz of chocolate milk a night.  She tends to eat mini muffins for breakfast every day.  Her protein consists of chicken, [place], occasionally steak.  She endorses a low appetite at dinner time.   B12 Deficiency: She previously would take chewable B12. She was not taking it consistently.  She is receptive to a B12 injection today.   Sharp head pain: She complains of a sharp pain on the left side of her head.  She notes that it tends to occur a couple of times a day.  Her last episode was last night around 11 pm.  Denies: n/v, dizziness, severe thunderclap pain, weakness, numbness/tingling, vision changes   Past Medical History:  Diagnosis Date   Allergy    Anxiety disorder    Depression    History of chicken pox    Hyperlipidemia    Hypertension    Tobacco abuse    started at age 54 years     Social History   Tobacco Use   Smoking status: Every Day    Packs/day: 1    Types: Cigarettes    Start date: 03/11/1960   Smokeless tobacco: Never  Vaping Use   Vaping Use: Never used  Substance Use Topics   Alcohol use: Never   Drug use: Never    Past Surgical History:  Procedure Laterality Date   ABDOMINAL HYSTERECTOMY  1978   CARDIAC CATHETERIZATION     esohagus stretched     TONSILLECTOMY      Family History  Problem Relation Age of Onset   Diabetes Mother    Heart disease Mother    Coronary artery disease Mother    Dementia Mother    Alcohol abuse Father    Heart attack Father    Depression Sister    Osteoarthritis Sister     Depression Sister    Prostate cancer Brother    Parkinson's disease Brother    Prostate cancer Brother    Aneurysm Brother    CAD Brother        17 stents   Lung cancer Maternal Grandfather     Allergies  Allergen Reactions   Procaine Other (See Comments)    Hypotension, faint  Can tolerate mepivacaine   Ivp Dye [Iodinated Contrast Media] Hives    swelling   Sulfa Antibiotics Hives    Current Medications:   Current Outpatient Medications:    aspirin EC 81 MG tablet, Take 81 mg by mouth daily. Swallow whole., Disp: , Rfl:    atorvastatin (LIPITOR) 40 MG tablet, TAKE 1 TABLET MY MOUTH EVERY OTHER DAY FOR CHOLESTEROL, Disp: 30 tablet, Rfl: 3   busPIRone (BUSPAR) 15 MG tablet, TAKE 1 TABLET TWICE DAILY, Disp: 180 tablet, Rfl: 3   citalopram (CELEXA) 40 MG tablet, TAKE 1 TABLET EVERY DAY, Disp: 90 tablet, Rfl: 10   docusate sodium (COLACE) 50 MG capsule, Take 50 mg by mouth 2 (two) times daily., Disp: , Rfl:    LORazepam (  ATIVAN) 0.5 MG tablet, TAKE 1/4 TABLET TO 1 TABLET DAILY AS NEEDED., Disp: 90 tablet, Rfl: 0   metoprolol succinate (TOPROL-XL) 25 MG 24 hr tablet, TAKE 1 TABLET (25 MG TOTAL) BY MOUTH DAILY., Disp: 90 tablet, Rfl: 10   nystatin ointment (MYCOSTATIN), Apply to affected area daily, Disp: 30 g, Rfl: 0   Review of Systems:   ROS Negative unless otherwise specified per HPI.  Vitals:   Vitals:   07/02/22 1110  BP: 130/76  Pulse: (!) 58  Temp: (!) 97.5 F (36.4 C)  TempSrc: Temporal  SpO2: 97%  Weight: 159 lb 6.1 oz (72.3 kg)  Height:  (1.575 m)     Body mass index is 29.15 kg/m.  Physical Exam:   Physical Exam Vitals and nursing note reviewed.  Constitutional:      General: She is not in acute distress.    Appearance: She is well-developed. She is not ill-appearing or toxic-appearing.  Cardiovascular:     Rate and Rhythm: Normal rate and regular rhythm.     Pulses: Normal pulses.     Heart sounds: Normal heart sounds, S1 normal and S2  normal.  Pulmonary:     Effort: Pulmonary effort is normal.     Breath sounds: Normal breath sounds.  Skin:    General: Skin is warm and dry.  Neurological:     General: No focal deficit present.     Mental Status: She is alert.     GCS: GCS eye subscore is 4. GCS verbal subscore is 5. GCS motor subscore is 6.     Cranial Nerves: Cranial nerves 2-12 are intact.     Sensory: Sensation is intact.     Motor: Motor function is intact.     Coordination: Coordination is intact.     Gait: Gait is intact. Gait normal.  Psychiatric:        Speech: Speech normal.        Behavior: Behavior normal. Behavior is cooperative.     Assessment and Plan:   Vitamin B12 deficiency B-12 injection provided today Recommend OTC sublingual B-12 up to 500 mcg daily Follow-up in 3-6 months, sooner if concerns May continue B-12 protocol if she would like and has transportation  Diabetes mellitus without complication No red flags Continue statin She declined medication Will start with dietary changes -- we discussed CHO-containing foods and discussed ways to reduce intake Follow-up in 4 months to recheck A1c and will consider medication  Nonintractable episodic headache, unspecified headache type Neuro exam is WNL Given new presentation of HA, recommend CT however she refused Discussed that I am unable to r/o CVA or mass without imaging, she is aware and declines any work-up She states she will reach out if any worsening symptoms   I,Rachel Rivera,acting as a scribe for [employer], PA.,have documented all relevant documentation on the behalf of Jarold Motto, PA,as directed by  Jarold Motto, PA while in the presence of Jarold Motto, [place].  I, Jarold Motto, [place], have reviewed all documentation for this visit. The documentation on 07/02/22 for the exam, diagnosis, procedures, and orders are all accurate and complete.   Jarold Motto, PA-C

## 2022-07-03 ENCOUNTER — Telehealth: Payer: Self-pay | Admitting: *Deleted

## 2022-07-03 NOTE — Telephone Encounter (Signed)
   Telephone encounter was:  Successful.  07/03/2022 Name: CHASSIDY LAYSON MRN: [id number] DOB: 1940-06-17  AMBERLEIGH GERKEN is a 82 y.o. year old female who is a primary care patient of Jarold Motto, [place] . The community resource team was consulted for assistance with Transportation Needs   Care guide performed the following interventions: Patient provided with information about care guide support team and interviewed to confirm resource needs. patient called back informed of transportation options and also mailed access [place] application  Follow Up Plan:  No further follow up planned at this time. The patient has been provided with needed resources.  Yehuda Mao Greenauer -[facility] [place], Population Health [phone] [address] , [place] [address] Email : [email or web address]

## 2022-07-03 NOTE — Telephone Encounter (Signed)
   Telephone encounter was:  Unsuccessful.  07/03/2022 Name: Sue Little MRN: [id number] DOB: 1940/05/20  Unsuccessful outbound call made today to assist with:  Transportation Needs   Outreach Attempt:  3rd Attempt.  Referral closed unable to contact patient.  A HIPAA compliant voice message was left requesting a return call.  Instructed patient to call back at [phone].  Yehuda Mao Greenauer -[facility] [place] [facility] [place], Population Health [phone] [address] , [place] [address] Email : [email or web address]

## 2022-07-25 ENCOUNTER — Encounter (HOSPITAL_COMMUNITY): Payer: Medicare HMO

## 2022-07-25 ENCOUNTER — Ambulatory Visit: Payer: Medicare HMO

## 2022-08-09 ENCOUNTER — Telehealth: Payer: Self-pay | Admitting: Pharmacist

## 2022-08-09 NOTE — Telephone Encounter (Signed)
Opened in error

## 2022-08-19 ENCOUNTER — Other Ambulatory Visit: Payer: Self-pay | Admitting: *Deleted

## 2022-08-19 DIAGNOSIS — I739 Peripheral vascular disease, unspecified: Secondary | ICD-10-CM

## 2022-08-22 ENCOUNTER — Ambulatory Visit: Payer: Medicare HMO | Admitting: Physician Assistant

## 2022-08-22 ENCOUNTER — Ambulatory Visit (HOSPITAL_COMMUNITY)
Admission: RE | Admit: 2022-08-22 | Discharge: 2022-08-22 | Disposition: A | Discharge status: Home / Self Care | Payer: Medicare HMO | Source: Ambulatory Visit | Attending: Vascular Surgery | Admitting: Vascular Surgery

## 2022-08-22 VITALS — BP 123/69 | HR 60 | Temp 98.2°F | Resp 20 | Ht 62.0 in | Wt 156.0 lb

## 2022-08-22 DIAGNOSIS — I739 Peripheral vascular disease, unspecified: Secondary | ICD-10-CM

## 2022-08-22 LAB — VAS US ABI WITH/WO TBI: Left ABI: 0.6

## 2022-08-22 NOTE — Progress Notes (Signed)
[facility] [place] HISTORY AND PHYSICAL   History of Present Illness:  Patient is a 82 y.o. year old female who presents for evaluation of LE cramping.  She did not have frank claudication symptoms with inconsistent calf cramps.  She was last seen by Dr. Darrick Penna on 07/13/20.  Patient had bilateral ABIs performed on June 12, 2020. Right side was 0.7 left side was 0.9.    She states her pain is the same and unpredictable.  The pain has not changed in the past 10 years.    Her goal was to start a walking program as well as a Risk analyst program with ASA and daily Statin.  Past Medical History:  Diagnosis Date   Allergy    Anxiety disorder    Depression    History of chicken pox    Hyperlipidemia    Hypertension    Tobacco abuse    started at age 82 years    Past Surgical History:  Procedure Laterality Date   ABDOMINAL HYSTERECTOMY  1978   CARDIAC CATHETERIZATION     esohagus stretched     TONSILLECTOMY      ROS:   General:  No weight loss, Fever, chills  HEENT: No recent headaches, no nasal bleeding, no visual changes, no sore throat  Neurologic: No dizziness, blackouts, seizures. No recent symptoms of stroke or mini- stroke. No recent episodes of slurred speech, or temporary blindness.  Cardiac: No recent episodes of chest pain/pressure, no shortness of breath at rest.  No shortness of breath with exertion.  Denies history of atrial fibrillation or irregular heartbeat  Vascular: No history of rest pain in feet.  No history of claudication.  No history of non-healing ulcer, No history of DVT   Pulmonary: No home oxygen, no productive cough, no hemoptysis,  No asthma or wheezing  Musculoskeletal:  [ ]  Arthritis, [ ]  Low back pain,  [ ]  Joint pain  Hematologic:No history of hypercoagulable state.  No history of easy bleeding.  No history of anemia  Gastrointestinal: No hematochezia or melena,  No gastroesophageal reflux, no trouble  swallowing  Urinary: [ ]  chronic Kidney disease, [ ]  on HD - [ ]  MWF or [ ]  TTHS, [ ]  Burning with urination, [ ]  Frequent urination, [ ]  Difficulty urinating;   Skin: No rashes  Psychological: No history of anxiety,  No history of depression  Social History Social History   Tobacco Use   Smoking status: Every Day    Packs/day: 1    Types: Cigarettes    Start date: 03/11/1960   Smokeless tobacco: Never  Vaping Use   Vaping Use: Never used  Substance Use Topics   Alcohol use: Never   Drug use: Never    Family History Family History  Problem Relation Age of Onset   Diabetes Mother    Heart disease Mother    Coronary artery disease Mother    Dementia Mother    Alcohol abuse Father    Heart attack Father    Depression Sister    Osteoarthritis Sister    Depression Sister    Prostate cancer Brother    Parkinson's disease Brother    Prostate cancer Brother    Aneurysm Brother    CAD Brother        17 stents   Lung cancer Maternal Grandfather     Allergies  Allergies  Allergen Reactions   Procaine Other (See Comments)    Hypotension, faint  Can tolerate  mepivacaine   Ivp Dye [Iodinated Contrast Media] Hives    swelling   Sulfa Antibiotics Hives     Current Outpatient Medications  Medication Sig Dispense Refill   aspirin EC 81 MG tablet Take 81 mg by mouth daily. Swallow whole.     atorvastatin (LIPITOR) 40 MG tablet TAKE 1 TABLET MY MOUTH EVERY OTHER DAY FOR CHOLESTEROL 30 tablet 3   busPIRone (BUSPAR) 15 MG tablet TAKE 1 TABLET TWICE DAILY 180 tablet 3   citalopram (CELEXA) 40 MG tablet TAKE 1 TABLET EVERY DAY 90 tablet 10   docusate sodium (COLACE) 50 MG capsule Take 50 mg by mouth 2 (two) times daily.     LORazepam (ATIVAN) 0.5 MG tablet TAKE 1/4 TABLET TO 1 TABLET DAILY AS NEEDED. 90 tablet 0   metoprolol succinate (TOPROL-XL) 25 MG 24 hr tablet TAKE 1 TABLET (25 MG TOTAL) BY MOUTH DAILY. 90 tablet 10   nystatin ointment (MYCOSTATIN) Apply to affected area  daily 30 g 0   No current facility-administered medications for this visit.    Physical Examination  There were no vitals filed for this visit.  There is no height or weight on file to calculate BMI.  General:  Alert and oriented, no acute distress HEENT: Normal Neck: No bruit or JVD Pulmonary: Clear to auscultation bilaterally Cardiac: Regular Rate and Rhythm without murmur Abdomen: Soft, non-tender, non-distended, no mass, no scars Skin: No rash Extremity Pulses:  2+ radial, brachial, femoral, dorsalis pedis, posterior tibial pulses bilaterally Musculoskeletal: No deformity or edema  Neurologic: Upper and lower extremity motor 5/5 and symmetric  DATA:  ABI Findings:  +---------+------------------+-----+----------+--------+  Right   Rt Pressure (mmHg)IndexWaveform  Comment   +---------+------------------+-----+----------+--------+  Brachial 166                                        +---------+------------------+-----+----------+--------+  PTA     111               0.67 monophasic          +---------+------------------+-----+----------+--------+  DP      95                0.57 monophasic          +---------+------------------+-----+----------+--------+  Great Toe83                0.50 Abnormal            +---------+------------------+-----+----------+--------+   +---------+------------------+-----+----------+-------+  Left    Lt Pressure (mmHg)IndexWaveform  Comment  +---------+------------------+-----+----------+-------+  Brachial 161                                       +---------+------------------+-----+----------+-------+  PTA     99                0.60 monophasic         +---------+------------------+-----+----------+-------+  DP      85                0.51 monophasic         +---------+------------------+-----+----------+-------+  Great Toe72                0.43 Abnormal            +---------+------------------+-----+----------+-------+   +-------+-----------+-----------+------------+------------+  ABI/TBIToday's ABIToday's TBIPrevious ABIPrevious TBI  +-------+-----------+-----------+------------+------------+  Right 0.67       0.50       0.70        0.32          +-------+-----------+-----------+------------+------------+  Left  0.60       0.43       0.87        0.71          +-------+-----------+-----------+------------+------------+     Bilateral ABIs appear decreased compared to prior study on 06/12/20.    Summary:  Right: Resting right ankle-brachial index indicates moderate right lower  extremity arterial disease. The right toe-brachial index is abnormal.   Left: Resting left ankle-brachial index indicates moderate left lower  extremity arterial disease. The left toe-brachial index is abnormal.     ASSESSMENT/PLAN:  Known PAD with inconsistent claudication symptoms.  She denies rest pain, non healing wounds and her claudication is about at 200 yards which over all is not daily.  She continues to smoke on a daily basis.  He ABI's show no change on the right LE with change minimally on the left.    I have encouraged her to ambulate and even try seated exercises daily.  If she develops ischemic symptoms she will call our office.  I will have her to return in 6 months for repeat studies. She is currently not at risk of limb loss.           Mosetta Pigeon PA-C [facility] [place] Office: [phone]  MD on call Randie Heinz

## 2022-08-23 LAB — VAS US ABI WITH/WO TBI: Right ABI: 0.67

## 2022-08-26 ENCOUNTER — Other Ambulatory Visit: Payer: Self-pay | Admitting: Physician Assistant

## 2022-08-27 ENCOUNTER — Encounter: Payer: Self-pay | Admitting: Dermatology

## 2022-08-27 ENCOUNTER — Ambulatory Visit (INDEPENDENT_AMBULATORY_CARE_PROVIDER_SITE_OTHER): Payer: Medicare HMO | Admitting: Dermatology

## 2022-08-27 VITALS — BP 137/87 | HR 60

## 2022-08-27 DIAGNOSIS — L578 Other skin changes due to chronic exposure to nonionizing radiation: Secondary | ICD-10-CM

## 2022-08-27 DIAGNOSIS — Z808 Family history of malignant neoplasm of other organs or systems: Secondary | ICD-10-CM | POA: Diagnosis not present

## 2022-08-27 DIAGNOSIS — L820 Inflamed seborrheic keratosis: Secondary | ICD-10-CM | POA: Diagnosis not present

## 2022-08-27 DIAGNOSIS — L821 Other seborrheic keratosis: Secondary | ICD-10-CM

## 2022-08-27 DIAGNOSIS — W908XXA Exposure to other nonionizing radiation, initial encounter: Secondary | ICD-10-CM

## 2022-08-27 DIAGNOSIS — D1801 Hemangioma of skin and subcutaneous tissue: Secondary | ICD-10-CM

## 2022-08-27 DIAGNOSIS — Z1283 Encounter for screening for malignant neoplasm of skin: Secondary | ICD-10-CM

## 2022-08-27 DIAGNOSIS — L814 Other melanin hyperpigmentation: Secondary | ICD-10-CM

## 2022-08-27 NOTE — Patient Instructions (Addendum)
Cryotherapy Aftercare  Wash gently with soap and water everyday.   Apply Vaseline and Band-Aid daily until healed.   Due to recent changes in healthcare laws, you may see results of your pathology and/or laboratory studies on MyChart before the doctors have had a chance to review them. We understand that in some cases there may be results that are confusing or concerning to you. Please understand that not all results are received at the same time and often the doctors may need to interpret multiple results in order to provide you with the best plan of care or course of treatment. Therefore, we ask that you please give us 2 business days to thoroughly review all your results before contacting the office for clarification. Should we see a critical lab result, you will be contacted sooner.   If You Need Anything After Your Visit  If you have any questions or concerns for your doctor, please call our main line at [phone] If no one answers, please leave a voicemail as directed and we will return your call as soon as possible. Messages left after 4 pm will be answered the following business day.   You may also send us a message via MyChart. We typically respond to MyChart messages within 1-2 business days.  For prescription refills, please ask your pharmacy to contact our office. Our fax number is [phone].  If you have an urgent issue when the clinic is closed that cannot wait until the next business day, you can page your doctor at the number below.    Please note that while we do our best to be available for urgent issues outside of office hours, we are not available 24/7.   If you have an urgent issue and are unable to reach us, you may choose to seek medical care at your doctor's office, retail clinic, urgent care center, or emergency room.  If you have a medical emergency, please immediately call 911 or go to the emergency department. In the event of inclement weather, please call our  main line at [phone] for an update on the status of any delays or closures.  Dermatology Medication Tips: Please keep the boxes that topical medications come in in order to help keep track of the instructions about where and how to use these. Pharmacies typically print the medication instructions only on the boxes and not directly on the medication tubes.   If your medication is too expensive, please contact our office at [phone] or send us a message through MyChart.   We are unable to tell what your co-pay for medications will be in advance as this is different depending on your insurance coverage. However, we may be able to find a substitute medication at lower cost or fill out paperwork to get insurance to cover a needed medication.   If a prior authorization is required to get your medication covered by your insurance company, please allow us 1-2 business days to complete this process.  Drug prices often vary depending on where the prescription is filled and some pharmacies may offer cheaper prices.  The website www.goodrx.com contains coupons for medications through different pharmacies. The prices here do not account for what the cost may be with help from insurance (it may be cheaper with your insurance), but the website can give you the price if you did not use any insurance.  - You can print the associated coupon and take it with your prescription to the pharmacy.  - You may also   stop by our office during regular business hours and pick up a GoodRx coupon card.  - If you need your prescription sent electronically to a different pharmacy, notify our office through [place] MyChart or by phone at [phone]     

## 2022-08-27 NOTE — Progress Notes (Signed)
   New Patient Visit   Subjective  Sue Little is a 82 y.o. female who presents for the following: FBSE  Patient is here to establish care and get FBSE. Patient reports Hx of bx. Patient reports family history of skin cancer(s)(Brother and Nephew).    The following portions of the chart were reviewed this encounter and updated as appropriate: medications, allergies, medical history  Review of Systems:  No other skin or systemic complaints except as noted in HPI or Assessment and Plan.  Objective  Well appearing patient in no apparent distress; mood and affect are within normal limits.  A full examination was performed including scalp, head, eyes, ears, nose, lips, neck, chest, axillae, abdomen, back, buttocks, bilateral upper extremities, bilateral lower extremities, hands, feet, fingers, toes, fingernails, and toenails. All findings within normal limits unless otherwise noted below.   Relevant exam findings are noted in the Assessment and Plan.  Assessment & Plan  LENTIGINES, SEBORRHEIC KERATOSES, HEMANGIOMAS - Benign normal skin lesions - Benign-appearing - Call for any changes  MELANOCYTIC NEVI - Tan-brown and/or pink-flesh-colored symmetric macules and papules - Benign appearing on exam today - Observation - Call clinic for new or changing moles - Recommend daily use of broad spectrum spf 30+ sunscreen to sun-exposed areas.   ACTINIC DAMAGE - Chronic condition, secondary to cumulative UV/sun exposure - diffuse scaly erythematous macules with underlying dyspigmentation - Recommend daily broad spectrum sunscreen SPF 30+ to sun-exposed areas, reapply every 2 hours as needed.  - Staying in the shade or wearing long sleeves, sun glasses (UVA+UVB protection) and wide brim hats (4-inch brim around the entire circumference of the hat) are also recommended for sun protection.  - Call for new or changing lesions.  SKIN CANCER SCREENING PERFORMED TODAY   No follow-ups on  file.  ***  Documentation: I have reviewed the above documentation for accuracy and completeness, and I agree with the above.  Stasia Cavalier, am acting as scribe for Langston Reusing, DO.  Langston Reusing, DO

## 2022-08-29 ENCOUNTER — Ambulatory Visit: Payer: Medicare HMO | Admitting: Dermatology

## 2022-09-04 ENCOUNTER — Other Ambulatory Visit: Payer: Self-pay

## 2022-09-04 DIAGNOSIS — I739 Peripheral vascular disease, unspecified: Secondary | ICD-10-CM

## 2022-09-08 ENCOUNTER — Encounter: Payer: Self-pay | Admitting: Dermatology

## 2022-10-21 ENCOUNTER — Ambulatory Visit: Payer: Medicare HMO | Admitting: Physician Assistant

## 2022-10-31 ENCOUNTER — Ambulatory Visit: Payer: Medicare HMO | Admitting: Physician Assistant

## 2022-11-01 ENCOUNTER — Ambulatory Visit: Payer: Medicare HMO | Admitting: Physician Assistant

## 2022-11-05 ENCOUNTER — Ambulatory Visit (INDEPENDENT_AMBULATORY_CARE_PROVIDER_SITE_OTHER): Payer: Medicare HMO | Admitting: Physician Assistant

## 2022-11-05 VITALS — BP 127/74 | HR 65 | Temp 97.8°F | Ht 62.0 in | Wt 159.0 lb

## 2022-11-05 DIAGNOSIS — E119 Type 2 diabetes mellitus without complications: Secondary | ICD-10-CM

## 2022-11-05 DIAGNOSIS — I739 Peripheral vascular disease, unspecified: Secondary | ICD-10-CM | POA: Diagnosis not present

## 2022-11-05 DIAGNOSIS — K5900 Constipation, unspecified: Secondary | ICD-10-CM

## 2022-11-05 DIAGNOSIS — E538 Deficiency of other specified B group vitamins: Secondary | ICD-10-CM

## 2022-11-05 DIAGNOSIS — F419 Anxiety disorder, unspecified: Secondary | ICD-10-CM

## 2022-11-05 DIAGNOSIS — N9089 Other specified noninflammatory disorders of vulva and perineum: Secondary | ICD-10-CM | POA: Diagnosis not present

## 2022-11-05 LAB — COMPREHENSIVE METABOLIC PANEL
ALT: 22 U/L (ref 0–35)
AST: 19 U/L (ref 0–37)
Albumin: 3.9 g/dL (ref 3.5–5.2)
Alkaline Phosphatase: 79 U/L (ref 39–117)
BUN: 9 mg/dL (ref 6–23)
CO2: 30 mEq/L (ref 19–32)
Calcium: 9.7 mg/dL (ref 8.4–10.5)
Chloride: 105 mEq/L (ref 96–112)
Creatinine, Ser: 0.97 mg/dL (ref 0.40–1.20)
GFR: 55 mL/min — ABNORMAL LOW (ref >60.00)
Glucose, Bld: 103 mg/dL — ABNORMAL HIGH (ref 70–99)
Potassium: 4 mEq/L (ref 3.5–5.1)
Sodium: 141 mEq/L (ref 135–145)
Total Bilirubin: 0.6 mg/dL (ref 0.2–1.2)
Total Protein: 6.6 g/dL (ref 6.0–8.3)

## 2022-11-05 LAB — LIPID PANEL
Cholesterol: 110 mg/dL (ref 0–200)
HDL: 35.1 mg/dL — ABNORMAL LOW (ref >39.00)
LDL Cholesterol: 38 mg/dL (ref 0–99)
NonHDL: 74.5
Total CHOL/HDL Ratio: 3
Triglycerides: 184 mg/dL — ABNORMAL HIGH (ref 0.0–149.0)
VLDL: 36.8 mg/dL (ref 0.0–40.0)

## 2022-11-05 LAB — POCT GLYCOSYLATED HEMOGLOBIN (HGB A1C): Hemoglobin A1C: 6.6 % — AB (ref 4.0–5.6)

## 2022-11-05 LAB — VITAMIN B12: Vitamin B-12: 828 pg/mL (ref 211–911)

## 2022-11-05 LAB — MICROALBUMIN / CREATININE URINE RATIO
Creatinine,U: 157.4 mg/dL
Microalb Creat Ratio: 0.8 mg/g (ref 0.0–30.0)
Microalb, Ur: 1.3 mg/dL (ref 0.0–1.9)

## 2022-11-05 NOTE — Patient Instructions (Signed)
It was great to see you!  Keep an eye on your leg symptom(s) and if they worsen -- reach out to your vascular provider; continue your exercises  Drink at least 64 oz of water daily; add in 1 capful of polyethylene glycol (also known as Miralax, however generic is fine!) to beverage of choice daily.  Please use Desitin -- zinc oxide and apply to area  If still no results, please call the office   Follow-up in 3 months, sooner if concerns  Take care,  Jarold Motto PA-C

## 2022-11-05 NOTE — Progress Notes (Signed)
Sue Little is a 82 y.o. female here for a follow up of a pre-existing problem.  History of Present Illness:   Chief Complaint  Patient presents with   Anxiety and Depression    HPI  Anxiety/Depression: Managed with 40 mg Celexa, 15 mg Buspar, and 0.5 mg Lorazepam.  Tolerating well.  Denies SI/HI  PAD: Reports that the pain still persists in her legs.  States that the exercises recommended by Clinton Gallant, PA-C mildly relieve pain.  Notes that the pain is so bad she sometimes wonders if she'll be able to walk from complex pool to her apartment.  Denies pain at rest or non-healing wounds.   Constipation: Reports she has been having irregular bowel movement in the past 2 weeks.  States she forgot about using Miralax, but has been using Colace and mineral water.  Notes that she tends to drink more tea and coffee instead of water.   Diabetes: Not currently managed with targeted medication or monitored at home. Reports she is watching her diet: introduced more fruit into diet (such as cantaloupe,watermelon, and peaches), eating 2 mini-muffins and coffee with creamer in the mornings, reduced sugar in tea, and half a glass of chocolate milk at night. Still no appetite at supper.   Lab Results  Component Value Date   HGBA1C 6.6 (A) 11/05/2022   Vulvar Irritation: Endorses mild burning irritation of vulva attributed to wearing pads for incontinence.  Past Medical History:  Diagnosis Date   Allergy    Anxiety disorder    Depression    History of chicken pox    Hyperlipidemia    Hypertension    Tobacco abuse    started at age 73 years     Social History   Tobacco Use   Smoking status: Every Day    Current packs/day: 1.00    Average packs/day: 1 pack/day for 62.7 years (62.7 ttl pk-yrs)    Types: Cigarettes    Start date: 03/11/1960   Smokeless tobacco: Never  Vaping Use   Vaping status: Never Used  Substance Use Topics   Alcohol use: Never   Drug use: Never     Past Surgical History:  Procedure Laterality Date   ABDOMINAL HYSTERECTOMY  1978   CARDIAC CATHETERIZATION     esohagus stretched     TONSILLECTOMY      Family History  Problem Relation Age of Onset   Diabetes Mother    Heart disease Mother    Coronary artery disease Mother    Dementia Mother    Alcohol abuse Father    Heart attack Father    Depression Sister    Osteoarthritis Sister    Depression Sister    Prostate cancer Brother    Parkinson's disease Brother    Prostate cancer Brother    Aneurysm Brother    CAD Brother        17 stents   Lung cancer Maternal Grandfather     Allergies  Allergen Reactions   Procaine Other (See Comments)    Hypotension, faint  Can tolerate mepivacaine   Ivp Dye [Iodinated Contrast Media] Hives    swelling   Sulfa Antibiotics Hives    Current Medications:   Current Outpatient Medications:    aspirin EC 81 MG tablet, Take 81 mg by mouth daily. Swallow whole., Disp: , Rfl:    atorvastatin (LIPITOR) 40 MG tablet, TAKE 1 TABLET MY MOUTH EVERY OTHER DAY FOR CHOLESTEROL, Disp: 45 tablet, Rfl: 2   busPIRone (BUSPAR)  15 MG tablet, TAKE 1 TABLET TWICE DAILY, Disp: 180 tablet, Rfl: 3   citalopram (CELEXA) 40 MG tablet, TAKE 1 TABLET EVERY DAY, Disp: 90 tablet, Rfl: 10   docusate sodium (COLACE) 50 MG capsule, Take 50 mg by mouth 2 (two) times daily., Disp: , Rfl:    LORazepam (ATIVAN) 0.5 MG tablet, TAKE 1/4 TABLET TO 1 TABLET DAILY AS NEEDED., Disp: 90 tablet, Rfl: 0   metoprolol succinate (TOPROL-XL) 25 MG 24 hr tablet, TAKE 1 TABLET (25 MG TOTAL) BY MOUTH DAILY., Disp: 90 tablet, Rfl: 10   nystatin ointment (MYCOSTATIN), Apply to affected area daily, Disp: 30 g, Rfl: 0   Review of Systems:   Review of Systems  Gastrointestinal:  Positive for constipation (past 2 weeks).  Genitourinary:        (+) Vulva Pain (mild burning irritation attributed to daily pad for incontinence)  Musculoskeletal:         (+) Leg pain (attributed to  PAD; see HPI)    Vitals:   Vitals:   11/05/22 1317  BP: 127/74  Pulse: 65  Temp: 97.8 F (36.6 C)  TempSrc: Temporal  SpO2: 97%  Weight: 159 lb (72.1 kg)  Height: 5\' 2"  (1.575 m)     Body mass index is 29.08 kg/m.  Physical Exam:   Physical Exam Vitals and nursing note reviewed.  Constitutional:      General: She is not in acute distress.    Appearance: She is well-developed. She is not ill-appearing or toxic-appearing.  Cardiovascular:     Rate and Rhythm: Normal rate and regular rhythm.     Pulses: Normal pulses.     Heart sounds: Normal heart sounds, S1 normal and S2 normal.  Pulmonary:     Effort: Pulmonary effort is normal.     Breath sounds: Normal breath sounds.  Skin:    General: Skin is warm and dry.  Neurological:     Mental Status: She is alert.     GCS: GCS eye subscore is 4. GCS verbal subscore is 5. GCS motor subscore is 6.  Psychiatric:        Speech: Speech normal.        Behavior: Behavior normal. Behavior is cooperative.    Results for orders placed or performed in visit on 11/05/22  POCT HgB A1C  Result Value Ref Range   Hemoglobin A1C 6.6 (A) 4.0 - 5.6 %   HbA1c POC (<> result, manual entry)     HbA1c, POC (prediabetic range)     HbA1c, POC (controlled diabetic range)      Assessment and Plan:   PAD (peripheral artery disease) (HCC) Ongoing Reviewed most recent vascular note Discussed with patient that if she is agreeable, we should further increase frequency of statin Additionally, I recommended she have close follow-up with vascular if symptom(s) worsen  Constipation, unspecified constipation type Ongoing Recommend daily miralax Defers/declines recommendations today, and has full decision making capacity -- refusing colonoscopy Continue to monitor  Diabetes mellitus without complication (HCC) Improved Declines medications and I think we can hold off on this for now Follow-up in 3 month(s), sooner if  concerns  Anxiety Controlled Continue 40 mg Celexa, 15 mg Buspar, and 0.5 mg Lorazepam.  Follow-up in 3 month(s), sooner if concerns  B12 deficiency Update B12 and provide recommendations  Vulvar irritation Not personally examined Recommend desitin cream to area and follow-up if persists/worsens   I,Emily Lagle,acting as a scribe for [employer], PA.,have documented all relevant documentation on  the behalf of Jarold Motto, PA,as directed by  Jarold Motto, PA while in the presence of Jarold Motto, [place].  I, Jarold Motto, [place], have reviewed all documentation for this visit. The documentation on 11/05/22 for the exam, diagnosis, procedures, and orders are all accurate and complete.  Jarold Motto, PA-C

## 2022-12-20 ENCOUNTER — Other Ambulatory Visit: Payer: Self-pay | Admitting: Physician Assistant

## 2022-12-20 NOTE — Telephone Encounter (Signed)
Patient called stating she doesn't know how she let her medication run out but she currently only has one pill left. I informed pt that PCP is out of office today and that this could take 48-72 hours to fill. Patient verbalized understanding but states she doesn't know what to do until then. Please Advise.

## 2022-12-21 ENCOUNTER — Other Ambulatory Visit: Payer: Self-pay | Admitting: Physician Assistant

## 2023-01-14 IMAGING — DX DG SHOULDER 2+V*R*
3 series · 3 of 3 positions shown · non-contrast
Comparison: None.

CLINICAL DATA: Chronic right shoulder pain, no known injury,
initial encounter

EXAM:
RIGHT SHOULDER - 2+ VIEW

[shoulder ap (1 of 2)]
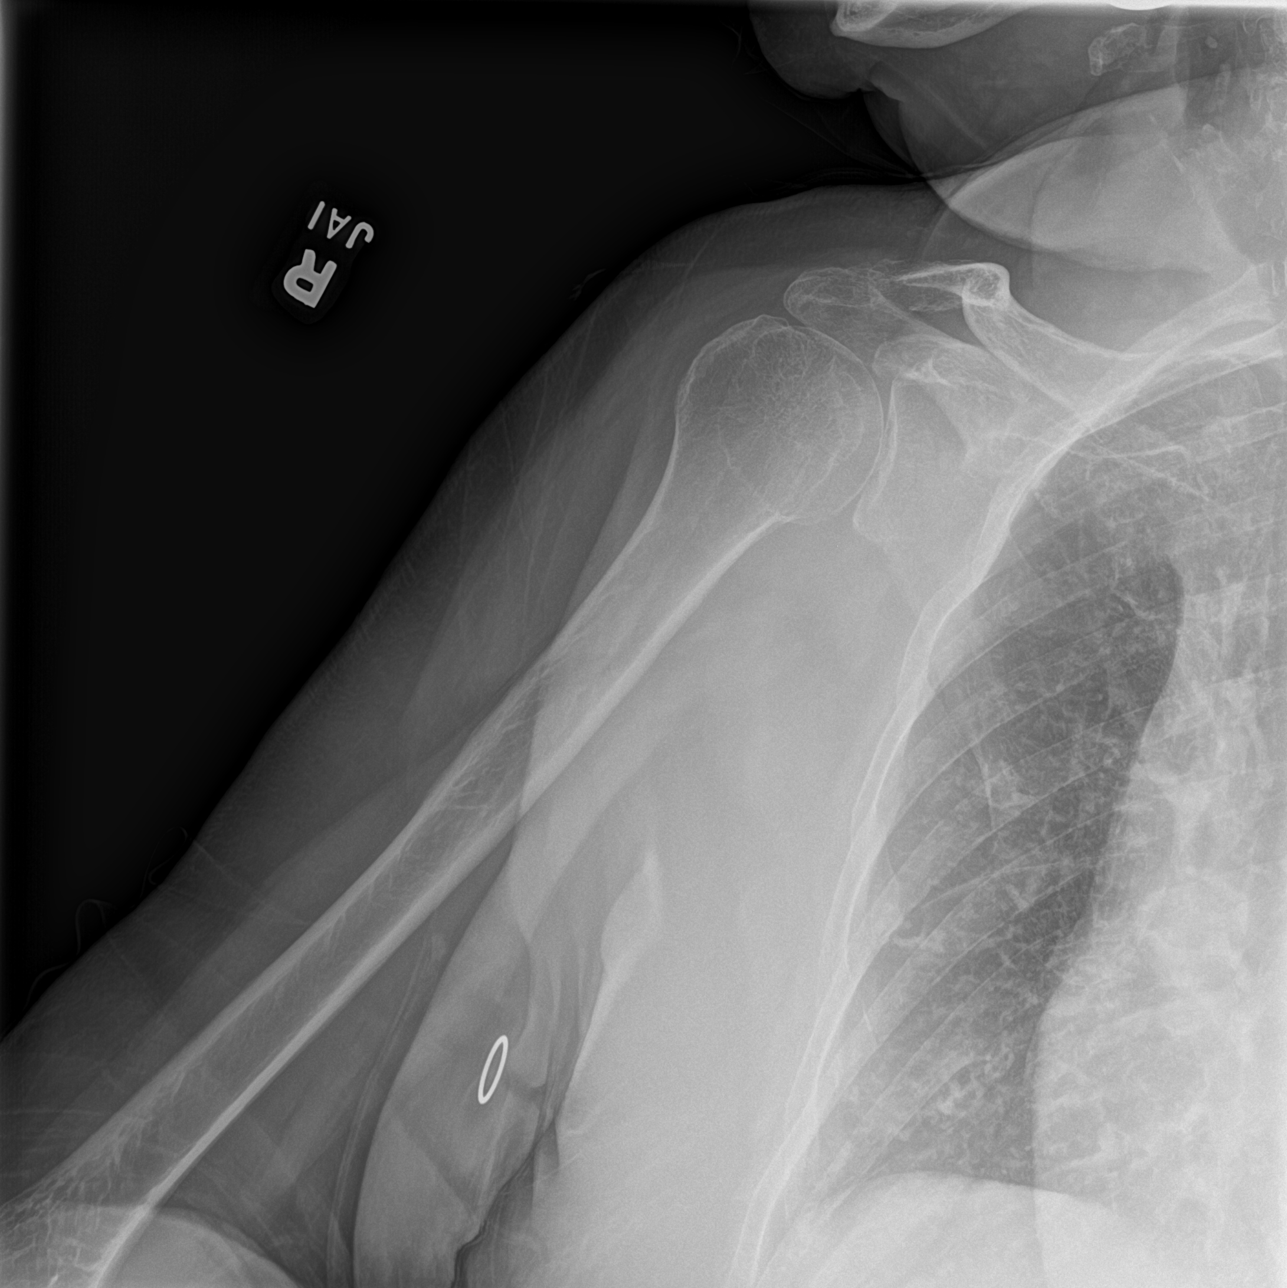

[shoulder ap (2 of 2)]
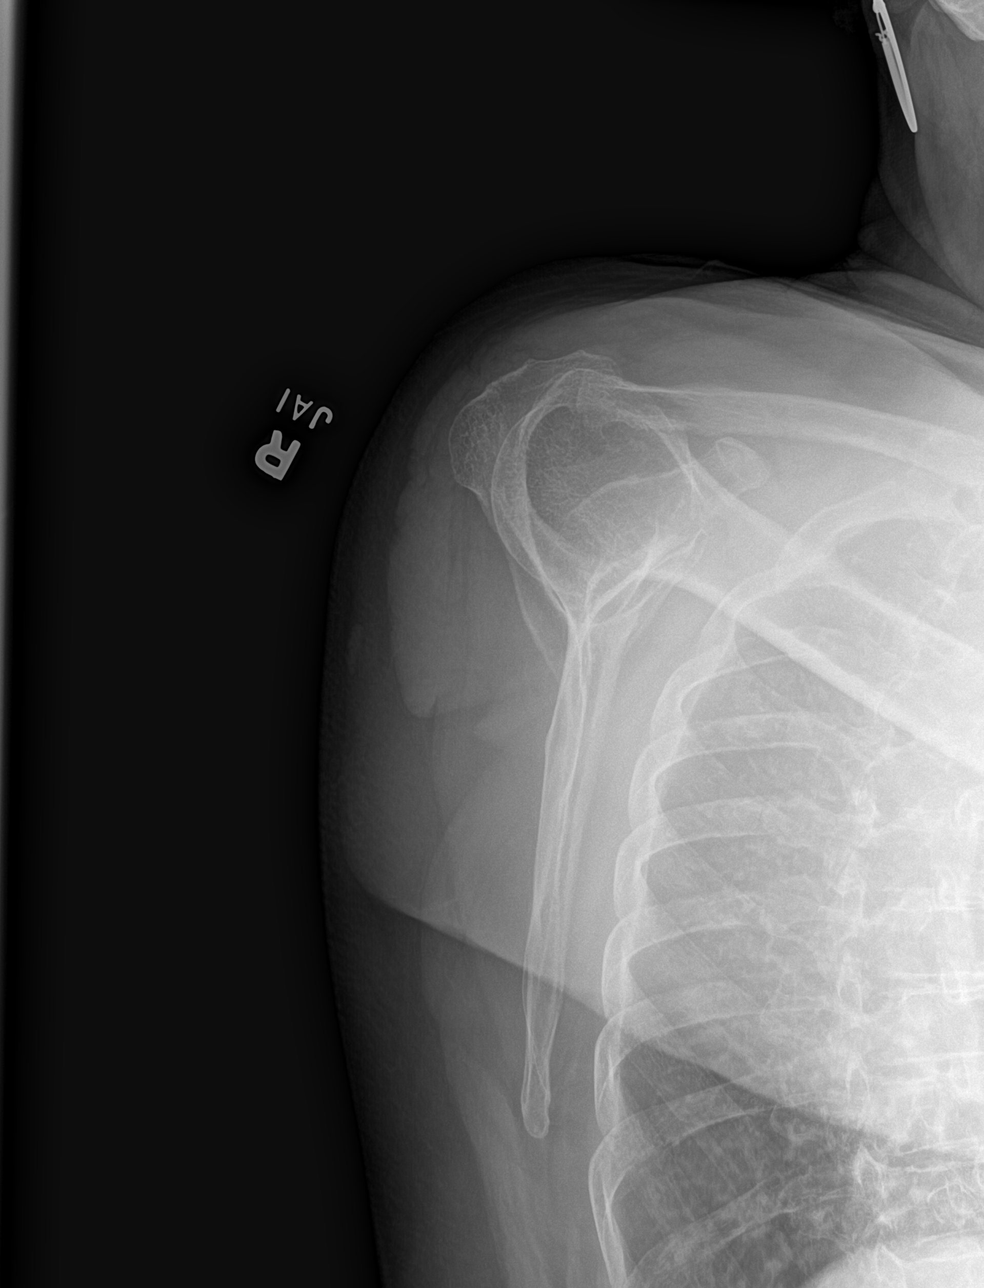

[shoulder axial]
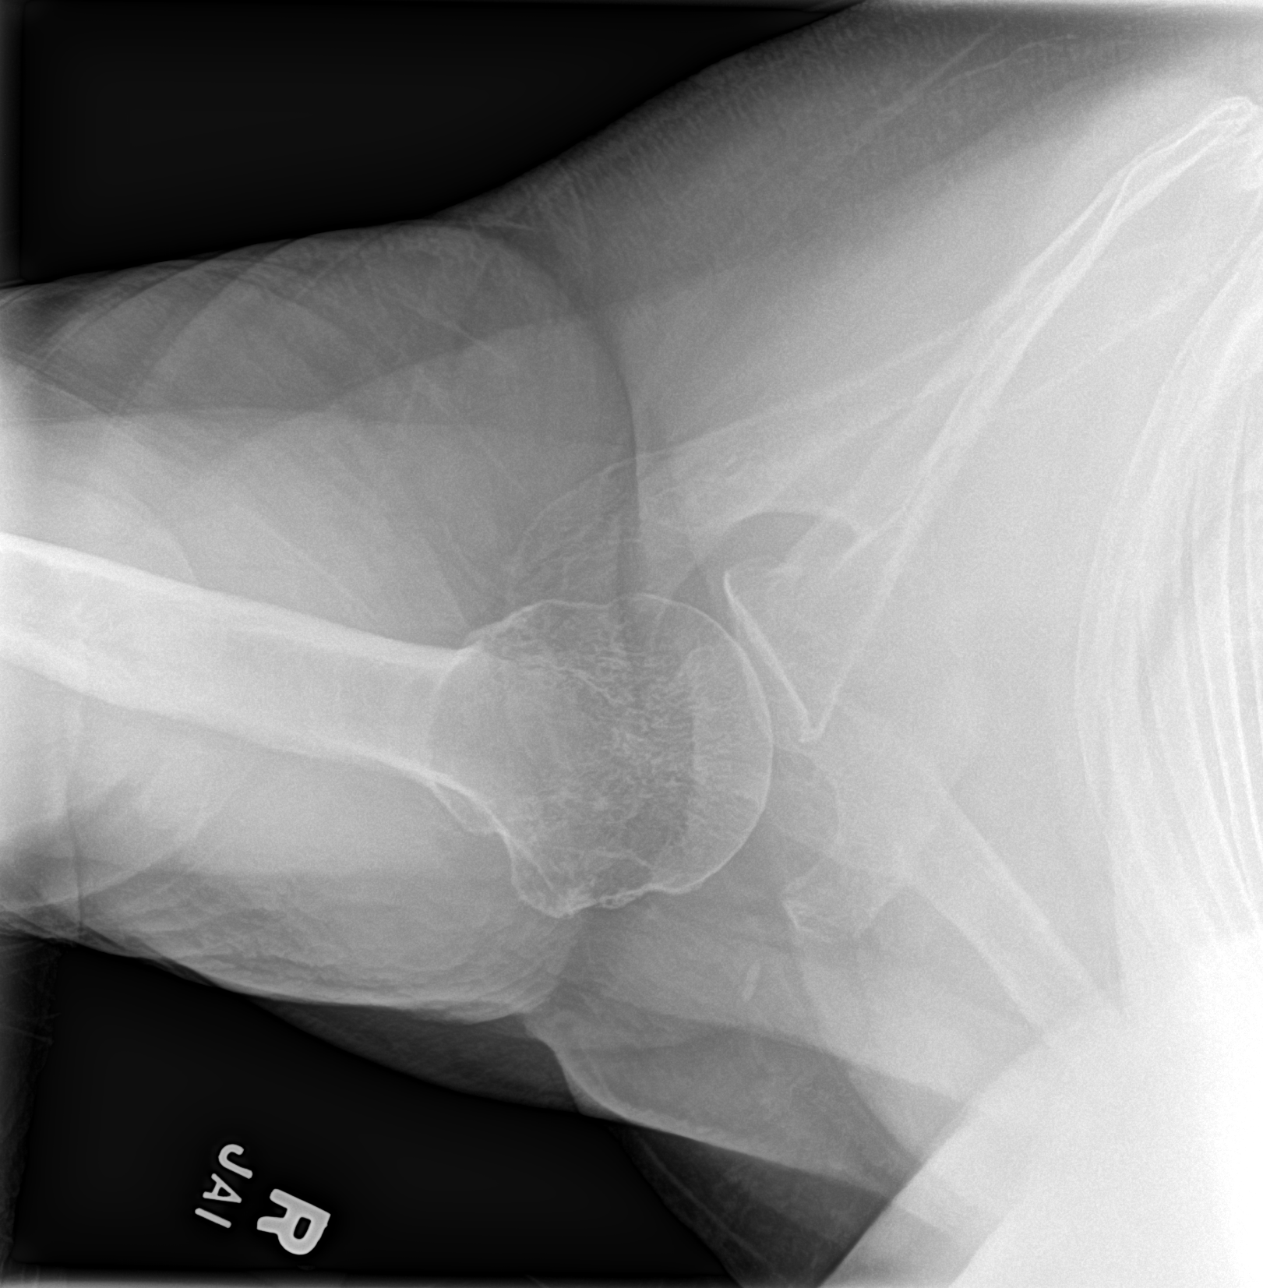

[3 of 3 positions shown; findings below may reference images not displayed]

FINDINGS: Degenerative changes of the acromioclavicular joint are noted. No
acute fracture or dislocation is seen. Underlying bony thorax
appears within normal limits.
IMPRESSION: Degenerative change without acute abnormality.

## 2023-01-14 IMAGING — DX DG LUMBAR SPINE 2-3V
3 series · 3 of 3 positions shown · non-contrast
Comparison: None.

CLINICAL DATA: Low back pain radiating into the right hip

EXAM:
LUMBAR SPINE - 3 VIEW

[l-spine ap]
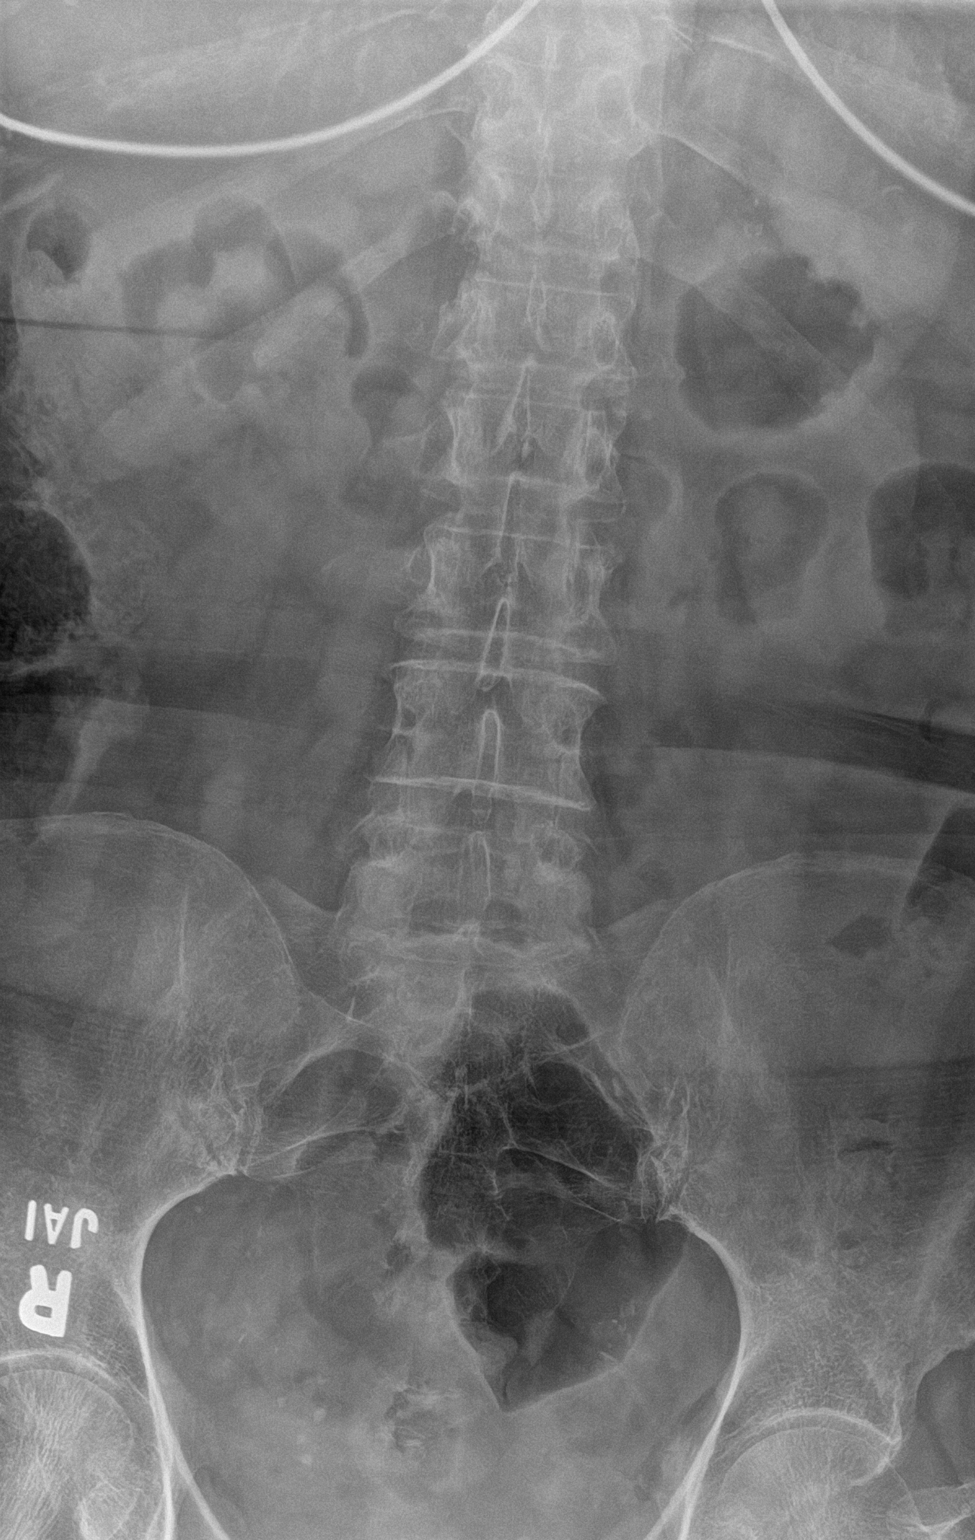

[l-spine lateral (1 of 2)]
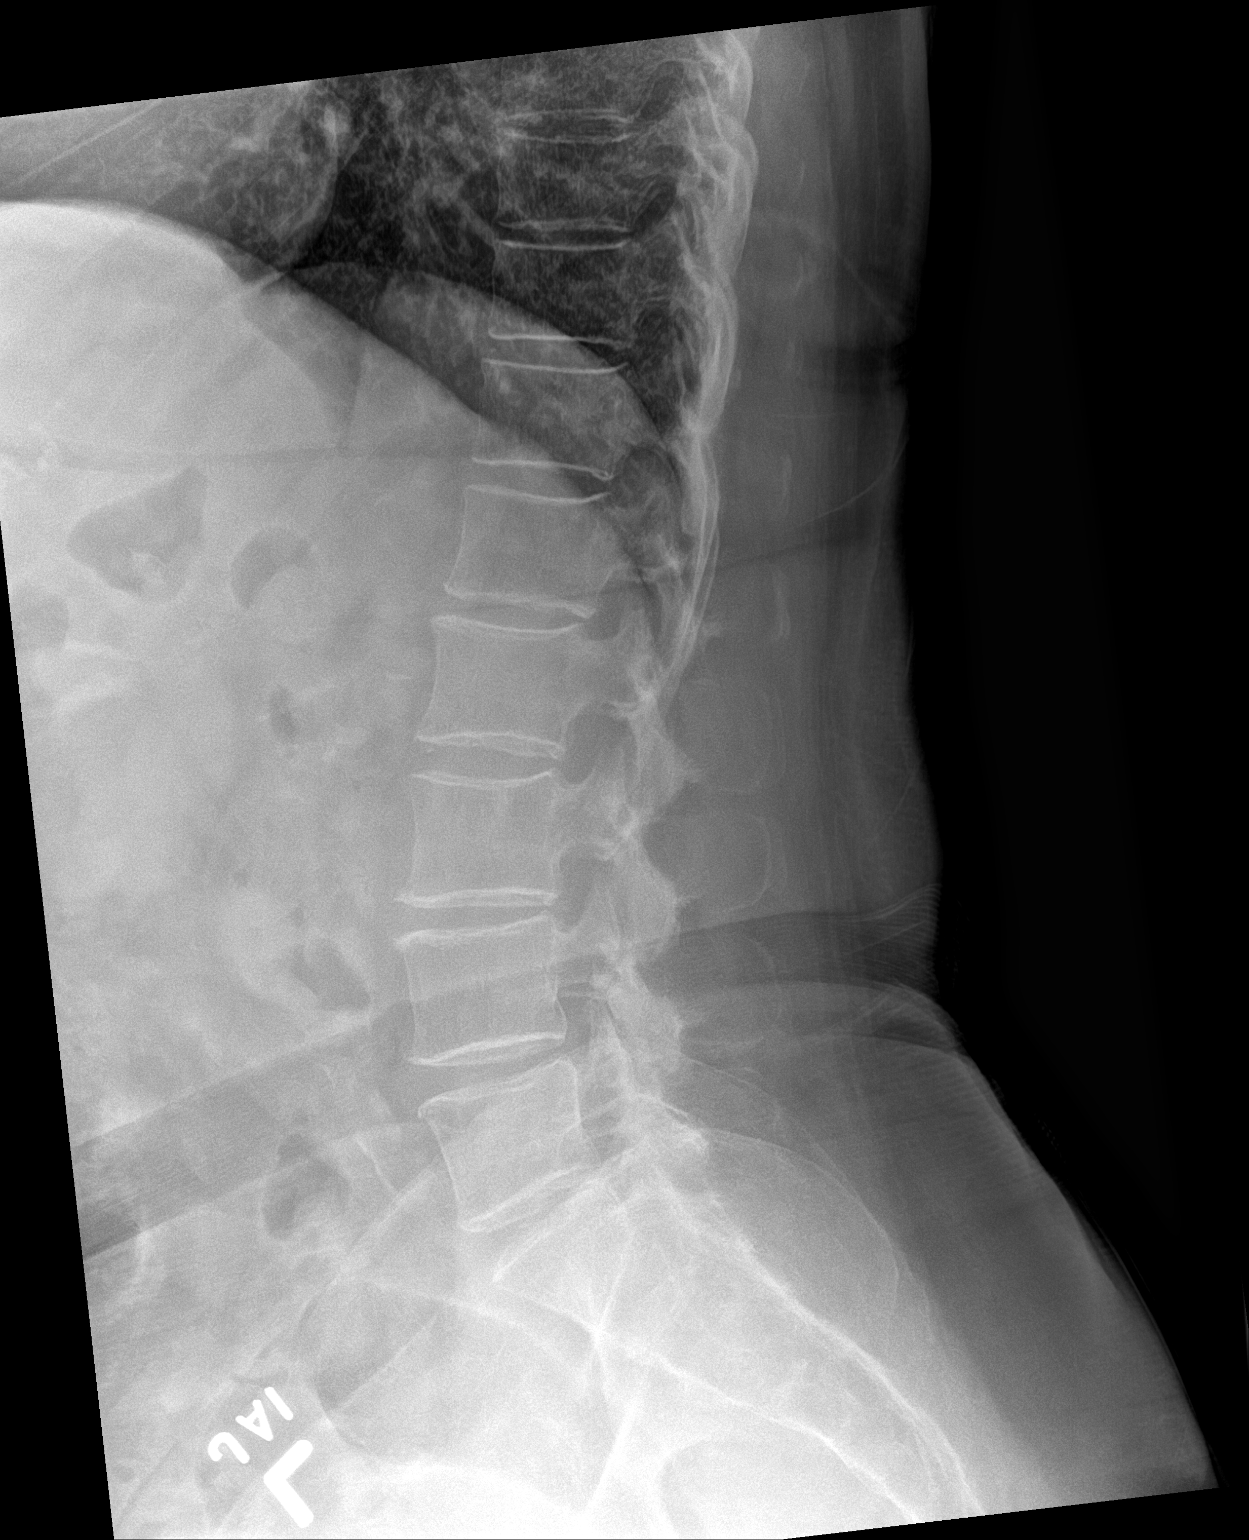

[l-spine lateral (2 of 2)]
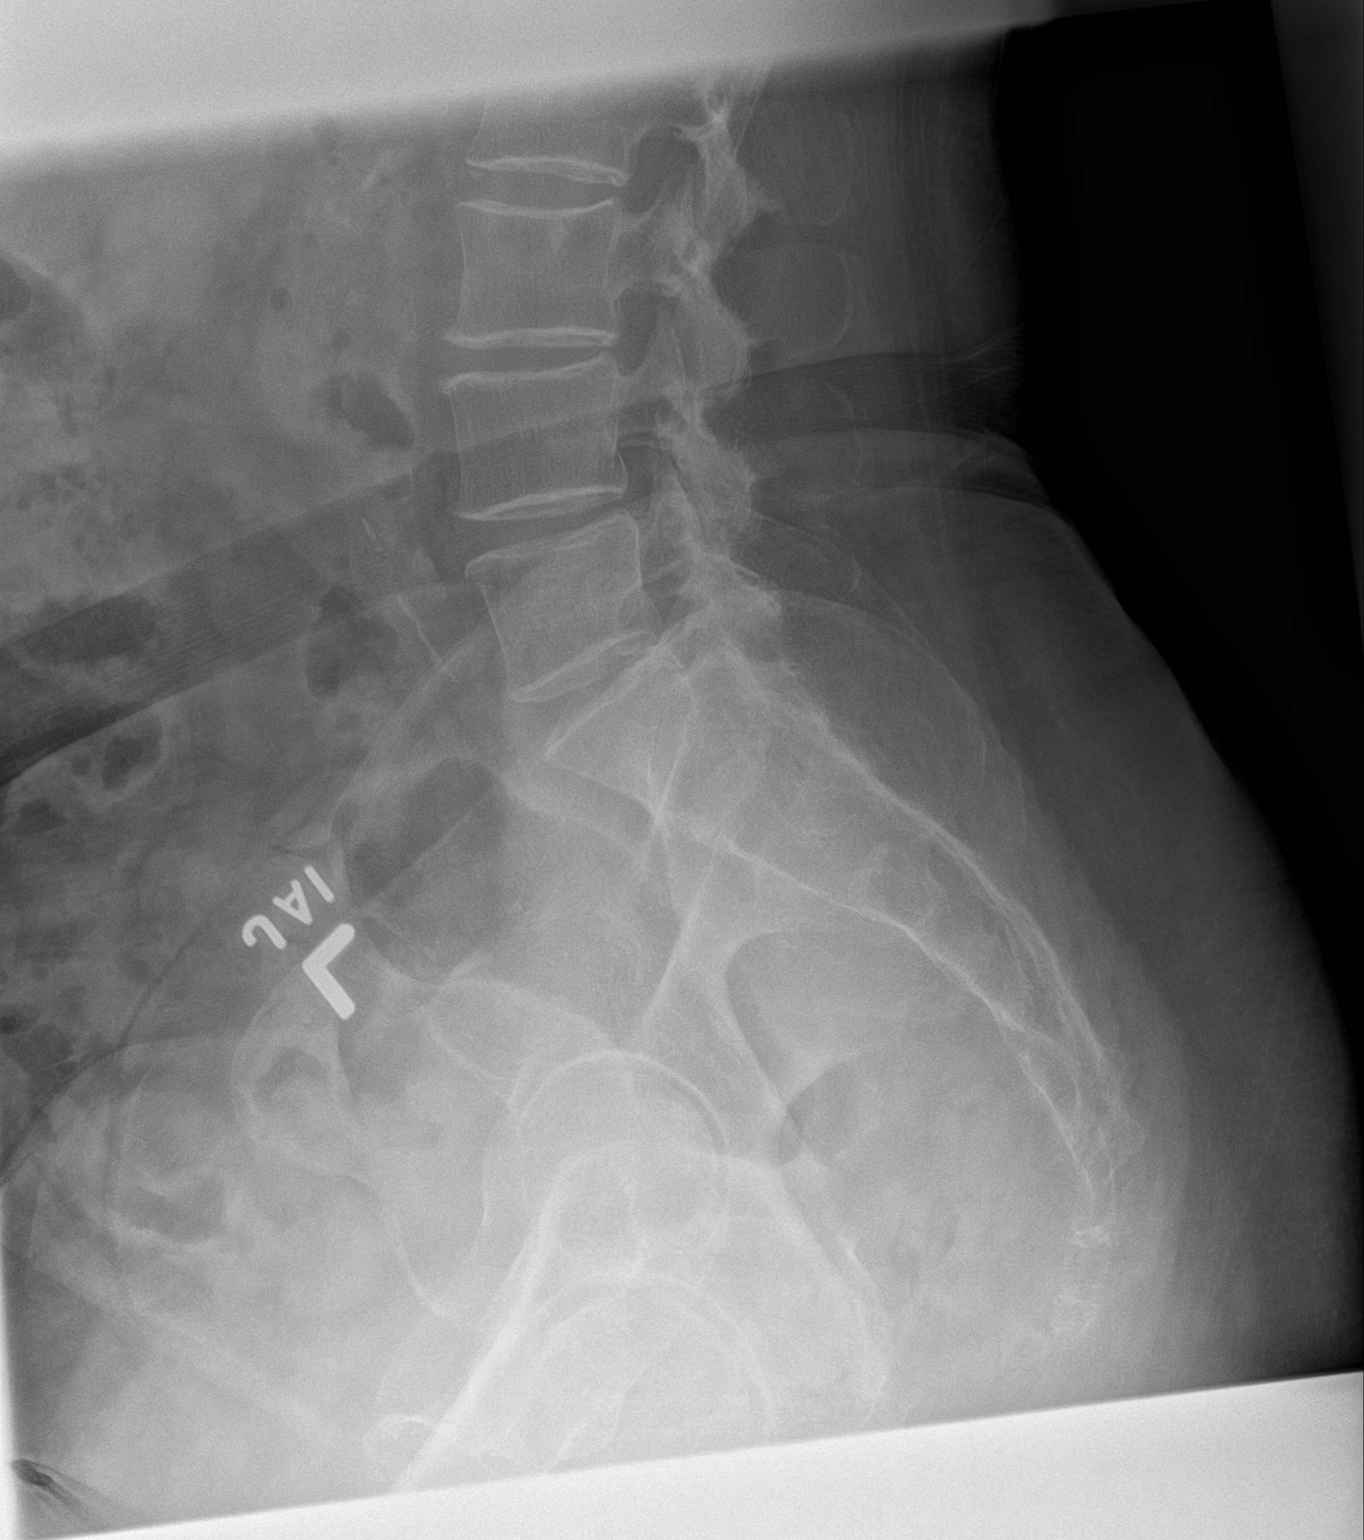

[3 of 3 positions shown; findings below may reference images not displayed]

FINDINGS: Five lumbar type vertebral bodies are well visualized. Vertebral
body height is well maintained. No anterolisthesis is noted. Mild
disc space narrowing is noted at L4-5 and L3-4. Facet hypertrophic
changes are seen. No soft tissue abnormality is noted.
IMPRESSION: Mild degenerative change without acute abnormality.

## 2023-01-28 ENCOUNTER — Encounter: Payer: Self-pay | Admitting: Pharmacist

## 2023-01-28 NOTE — Progress Notes (Signed)
Pharmacy Quality Measure Review  Patient was noted to fail MAC and MAD measure for 2023. Pharmacy Population Health team has been following adherence for 2024 for cholesterol and diabetes medications.   She is no longer taking metformin - LR 11/29/2021 and A1c was last checked 11/05/2022 and was 6.6%  She is taking atorvastatin 40mg  on Mondays, Wednesday and Fridays per last notes on lipid results - med list has take one tablet every OTHER day.   Patient has filled atorvastatin on time in 2024. LDL is at goal.   Lab Results  Component Value Date   CHOL 110 11/05/2022   HDL 35.10 (L) 11/05/2022   LDLCALC 38 11/05/2022   LDLDIRECT 52.0 06/25/2022   TRIG 184.0 (H) 11/05/2022   CHOLHDL 3 11/05/2022      Dispenses   Dispensed Days Supply Quantity Provider Pharmacy  atorvastatin 40 mg tablet 01/26/2023 90 45 tablet Sue [place], [place] [phone] - [facility]...  ATORVASTATIN 40 MG TABLET 10/18/2022 90 45 each [place], [place] [facility] [phone] - [facility]...  ATORVASTATIN 40 MG TABLET 07/24/2022 90 45 each [place], [place] [facility] [phone] - [facility]...  ATORVASTATIN 40 MG TABLET 05/02/2022 90 45 each [place], [place] [facility] [phone] - G...        Patient will pass MAC measure for 2024 and per refill records is taking atorvastatin regularly.   Sue Little, PharmD Clinical Pharmacist [facility]  Population Health [phone]

## 2023-02-08 ENCOUNTER — Other Ambulatory Visit: Payer: Self-pay | Admitting: Physician Assistant

## 2023-02-18 ENCOUNTER — Encounter: Payer: Self-pay | Admitting: Physician Assistant

## 2023-02-18 ENCOUNTER — Ambulatory Visit (INDEPENDENT_AMBULATORY_CARE_PROVIDER_SITE_OTHER): Payer: Medicare HMO | Admitting: Physician Assistant

## 2023-02-18 VITALS — BP 122/74 | HR 61 | Temp 97.3°F | Ht 62.0 in | Wt 161.2 lb

## 2023-02-18 DIAGNOSIS — I739 Peripheral vascular disease, unspecified: Secondary | ICD-10-CM

## 2023-02-18 DIAGNOSIS — E119 Type 2 diabetes mellitus without complications: Secondary | ICD-10-CM | POA: Diagnosis not present

## 2023-02-18 DIAGNOSIS — E782 Mixed hyperlipidemia: Secondary | ICD-10-CM

## 2023-02-18 DIAGNOSIS — F419 Anxiety disorder, unspecified: Secondary | ICD-10-CM

## 2023-02-18 DIAGNOSIS — F32A Depression, unspecified: Secondary | ICD-10-CM

## 2023-02-18 DIAGNOSIS — R079 Chest pain, unspecified: Secondary | ICD-10-CM

## 2023-02-18 MED ORDER — CITALOPRAM HYDROBROMIDE 40 MG PO TABS: ORAL_TABLET | ORAL | 0 refills | Status: DC

## 2023-02-18 MED ORDER — BUSPIRONE HCL 15 MG PO TABS: ORAL_TABLET | ORAL | 3 refills | Status: DC

## 2023-02-18 MED ORDER — LORAZEPAM 0.5 MG PO TABS: ORAL_TABLET | ORAL | 2 refills | Status: DC

## 2023-02-18 NOTE — Patient Instructions (Signed)
It was great to see you!  I will order CT scan of your heart  We will update blood work today  Let's follow-up in 6 months, sooner if you have concerns.  Take care,  Jarold Motto PA-C

## 2023-02-18 NOTE — Progress Notes (Signed)
Sue Little is a 82 y.o. female here for a follow-up of a pre-existing problem.  History of Present Illness:   Chief Complaint  Patient presents with   Diabetes   Anxiety   Depression   HPI  PAD: Reports persistence of bilateral leg pain.  Notes she does continue to smoke cigarettes.  States she struggles to walk further than 200 yards, so she hesitates to walk daily, but she does do recommended exercises a couple times a week.  Endorses increasing frequency of paresthesia, especially in her right foot.  Notes her next vascular ultrasound is scheduled for 04/22/23.  Denies pain at rest or non-healing wounds.    Diabetes: Not currently managed with targeted medication.  Notes that she feels like her blood sugars have been well-controlled.  Reports she has added a handful of cashews to her diet.  Denies hypoglycemic or hyperglycemic episodes or symptoms.  Recheck A1c today.  Lab Results  Component Value Date   HGBA1C 6.6 (A) 11/05/2022   Hyperlipidemia: Managed/Compliant with 81 mg ASA daily and 40 mg Atorvastatin every other day. Lab Results  Component Value Date   CHOL 110 11/05/2022   HDL 35.10 (L) 11/05/2022   LDLCALC 38 11/05/2022   LDLDIRECT 52.0 06/25/2022   TRIG 184.0 (H) 11/05/2022   CHOLHDL 3 11/05/2022   Hypertension: Managed/Compliant with 25 mg Metoprolol succinate daily.  Denies excessive caffeine intake, stimulant usage, excessive alcohol intake, or increase in salt consumption.  Denies chest pain, shortness of breath, blurred vision, dizziness, unusual headaches, or lower leg swelling.  BP Readings from Last 3 Encounters:  02/18/23 122/74  11/05/22 127/74  08/27/22 137/87   Anxiety/Depression: Managed with 40 mg Celexa daily, 15 mg Buspar BID, and a quarter of 0.5 mg Lorazepam PRN.  Reports tolerating medications well. Denies SI/HI.  Chest Pain (Left): Reports that she has been having pain in her chest left-sided. Describes pain feels more  interiorly.   Last mammogram 04/23/2021. Discussed having calcium score done.  Denies pain reproducible on palpation.     Past Medical History:  Diagnosis Date   Allergy    Anxiety disorder    Depression    History of chicken pox    Hyperlipidemia    Hypertension    Tobacco abuse    started at age 57 years    Social History   Tobacco Use   Smoking status: Every Day    Current packs/day: 1.00    Average packs/day: 1 pack/day for 62.9 years (62.9 ttl pk-yrs)    Types: Cigarettes    Start date: 03/11/1960   Smokeless tobacco: Never  Vaping Use   Vaping status: Never Used  Substance Use Topics   Alcohol use: Never   Drug use: Never   Past Surgical History:  Procedure Laterality Date   ABDOMINAL HYSTERECTOMY  1978   CARDIAC CATHETERIZATION     esohagus stretched     TONSILLECTOMY     Family History  Problem Relation Age of Onset   Diabetes Mother    Heart disease Mother    Coronary artery disease Mother    Dementia Mother    Alcohol abuse Father    Heart attack Father    Depression Sister    Osteoarthritis Sister    Depression Sister    Prostate cancer Brother    Parkinson's disease Brother    Prostate cancer Brother    Aneurysm Brother    CAD Brother        17 stents  Lung cancer Maternal Grandfather    Allergies  Allergen Reactions   Procaine Other (See Comments)    Hypotension, faint  Can tolerate mepivacaine   Ivp Dye [Iodinated Contrast Media] Hives    swelling   Sulfa Antibiotics Hives   Current Medications:   Current Outpatient Medications:    aspirin EC 81 MG tablet, Take 81 mg by mouth daily. Swallow whole., Disp: , Rfl:    atorvastatin (LIPITOR) 40 MG tablet, TAKE 1 TABLET MY MOUTH EVERY OTHER DAY FOR CHOLESTEROL, Disp: 45 tablet, Rfl: 2   busPIRone (BUSPAR) 15 MG tablet, TAKE 1 TABLET TWICE DAILY, Disp: 180 tablet, Rfl: 3   citalopram (CELEXA) 40 MG tablet, TAKE 1 TABLET EVERY DAY, Disp: 90 tablet, Rfl: 0   LORazepam (ATIVAN) 0.5 MG  tablet, TAKE 1/4 TABLET TO 1 TABLET DAILY AS NEEDED., Disp: 30 tablet, Rfl: 2   metoprolol succinate (TOPROL-XL) 25 MG 24 hr tablet, TAKE 1 TABLET EVERY DAY, Disp: 90 tablet, Rfl: 3   nystatin ointment (MYCOSTATIN), Apply to affected area daily, Disp: 30 g, Rfl: 0  Review of Systems:   ROS See pertinent positives and negatives as per the HPI.  Vitals:   Vitals:   02/18/23 1413  BP: 122/74  Pulse: 61  Temp: (!) 97.3 F (36.3 C)  TempSrc: Temporal  SpO2: 98%  Weight: 161 lb 4 oz (73.1 kg)  Height: 5\' 2"  (1.575 m)     Body mass index is 29.49 kg/m.  Physical Exam:   Physical Exam Vitals and nursing note reviewed.  Constitutional:      General: She is not in acute distress.    Appearance: She is well-developed. She is not ill-appearing or toxic-appearing.  Cardiovascular:     Rate and Rhythm: Normal rate and regular rhythm.     Pulses: Normal pulses.     Heart sounds: Normal heart sounds, S1 normal and S2 normal.  Pulmonary:     Effort: Pulmonary effort is normal.     Breath sounds: Normal breath sounds.  Feet:     Right foot:     Skin integrity: Skin integrity normal.     Left foot:     Skin integrity: Skin integrity normal.  Skin:    General: Skin is warm and dry.  Neurological:     Mental Status: She is alert.     GCS: GCS eye subscore is 4. GCS verbal subscore is 5. GCS motor subscore is 6.  Psychiatric:        Speech: Speech normal.        Behavior: Behavior normal. Behavior is cooperative.     Assessment and Plan:   PAD (peripheral artery disease) (HCC) Reviewed Management per vascular Encouraged smoking reduction - she is not ready to consider this No red flags or signs of ischemia on my brief exam  Diabetes mellitus without complication (HCC) Update Hemoglobin A1c and provide recommendations today Continue statin use Follow-up in 6 months, sooner if worsening A1c control  Anxiety and depression Overall controlled Continue 40 mg Celexa daily, 15  mg Buspar BID, and a quarter of 0.5 mg Lorazepam PRN.  Follow-up in 6 months, sooner if concerns  Mixed hyperlipidemia Update lipid panel and adjust Lipitor 40 mg every other day as indicated Continue aspirin   Chest pain, unspecified type Patient declines any formal work-up including EKG or cardiology referral If worsens, needs to go to the ER She is agreeable to calcium CT score -- will order   I,Emily Lagle,acting as  a scribe for Jarold Motto, PA.,have documented all relevant documentation on the behalf of Jarold Motto, PA,as directed by  Jarold Motto, PA while in the presence of Jarold Motto, [place].  I, Jarold Motto, [place], have reviewed all documentation for this visit. The documentation on 02/18/23 for the exam, diagnosis, procedures, and orders are all accurate and complete.  Jarold Motto, PA-C

## 2023-02-19 LAB — COMPREHENSIVE METABOLIC PANEL
ALT: 26 U/L (ref 0–35)
AST: 22 U/L (ref 0–37)
Albumin: 4.1 g/dL (ref 3.5–5.2)
Alkaline Phosphatase: 83 U/L (ref 39–117)
BUN: 11 mg/dL (ref 6–23)
CO2: 30 meq/L (ref 19–32)
Calcium: 9.6 mg/dL (ref 8.4–10.5)
Chloride: 103 meq/L (ref 96–112)
Creatinine, Ser: 0.92 mg/dL (ref 0.40–1.20)
GFR: 58.49 mL/min — ABNORMAL LOW (ref >60.00)
Glucose, Bld: 125 mg/dL — ABNORMAL HIGH (ref 70–99)
Potassium: 4 meq/L (ref 3.5–5.1)
Sodium: 140 meq/L (ref 135–145)
Total Bilirubin: 0.5 mg/dL (ref 0.2–1.2)
Total Protein: 6.7 g/dL (ref 6.0–8.3)

## 2023-02-19 LAB — CBC WITH DIFFERENTIAL/PLATELET
Basophils Absolute: 0.1 10*3/uL (ref 0.0–0.1)
Basophils Relative: 1.2 % (ref 0.0–3.0)
Eosinophils Absolute: 0.3 10*3/uL (ref 0.0–0.7)
Eosinophils Relative: 2.7 % (ref 0.0–5.0)
HCT: 42.8 % (ref 36.0–46.0)
Hemoglobin: 13.8 g/dL (ref 12.0–15.0)
Lymphocytes Relative: 33.1 % (ref 12.0–46.0)
Lymphs Abs: 3.4 10*3/uL (ref 0.7–4.0)
MCHC: 32.3 g/dL (ref 30.0–36.0)
MCV: 88.2 fL (ref 78.0–100.0)
Monocytes Absolute: 0.7 10*3/uL (ref 0.1–1.0)
Monocytes Relative: 7.1 % (ref 3.0–12.0)
Neutro Abs: 5.7 10*3/uL (ref 1.4–7.7)
Neutrophils Relative %: 55.9 % (ref 43.0–77.0)
Platelets: 246 10*3/uL (ref 150.0–400.0)
RBC: 4.86 Mil/uL (ref 3.87–5.11)
RDW: 13.2 % (ref 11.5–15.5)
WBC: 10.3 10*3/uL (ref 4.0–10.5)

## 2023-02-19 LAB — LIPID PANEL
Cholesterol: 125 mg/dL (ref 0–200)
HDL: 38.1 mg/dL — ABNORMAL LOW (ref >39.00)
LDL Cholesterol: 47 mg/dL (ref 0–99)
NonHDL: 86.75
Total CHOL/HDL Ratio: 3
Triglycerides: 201 mg/dL — ABNORMAL HIGH (ref 0.0–149.0)
VLDL: 40.2 mg/dL — ABNORMAL HIGH (ref 0.0–40.0)

## 2023-02-19 LAB — HEMOGLOBIN A1C: Hgb A1c MFr Bld: 7.4 % — ABNORMAL HIGH (ref 4.6–6.5)

## 2023-02-20 ENCOUNTER — Ambulatory Visit (HOSPITAL_COMMUNITY): Payer: Medicare HMO

## 2023-02-20 ENCOUNTER — Other Ambulatory Visit: Payer: Self-pay | Admitting: *Deleted

## 2023-02-20 ENCOUNTER — Ambulatory Visit: Payer: Medicare HMO

## 2023-02-20 MED ORDER — METFORMIN HCL 500 MG PO TABS: 500.0000 mg | ORAL_TABLET | Freq: Every day | ORAL | 1 refills | Status: DC

## 2023-04-23 ENCOUNTER — Ambulatory Visit (HOSPITAL_COMMUNITY): Payer: No Typology Code available for payment source

## 2023-04-23 ENCOUNTER — Ambulatory Visit: Payer: No Typology Code available for payment source

## 2023-06-02 ENCOUNTER — Other Ambulatory Visit: Payer: Self-pay | Admitting: Physician Assistant

## 2023-06-02 NOTE — Telephone Encounter (Signed)
 Copied from CRM [id number]. Topic: Clinical - Medication Refill >> Jun 02, 2023  4:48 PM Florestine Avers wrote: Most Recent Primary Care Visit:  Provider: Jarold Motto  Department: [facility]  Visit Type: OFFICE VISIT  Date: 02/18/2023  Medication: LORazepam (ATIVAN) 0.5 MG tablet  Has the patient contacted their pharmacy? Yes (Agent: If no, request that the patient contact the pharmacy for the refill. If patient does not wish to contact the pharmacy document the reason why and proceed with request.) (Agent: If yes, when and what did the pharmacy advise?)  Is this the correct pharmacy for this prescription? Yes If no, delete pharmacy and type the correct one.  This is the patient's preferred pharmacy:  [id number] [place], [place] - [address] [place] [address] Phone: [phone] Fax: [phone]  Has the prescription been filled recently? No  Is the patient out of the medication? Yes  Has the patient been seen for an appointment in the last year OR does the patient have an upcoming appointment?  Yes Can we respond through MyChart? No  Agent: Please be advised that Rx refills may take up to 3 business days. We ask that you follow-up with your pharmacy.

## 2023-06-03 ENCOUNTER — Ambulatory Visit (INDEPENDENT_AMBULATORY_CARE_PROVIDER_SITE_OTHER): Payer: Medicare HMO

## 2023-06-03 VITALS — Ht 62.0 in | Wt 161.0 lb

## 2023-06-03 DIAGNOSIS — Z0000 Encounter for general adult medical examination without abnormal findings: Secondary | ICD-10-CM

## 2023-06-03 NOTE — Progress Notes (Addendum)
 Subjective:   Sue Little is a 83 y.o. who presents for a Medicare Wellness preventive visit.  Visit Complete: Virtual I connected with  [name] on 06/03/23 by a audio enabled telemedicine application and verified that I am speaking with the correct person using two identifiers.  Patient Location: Home  Provider Location: Office/Clinic  I discussed the limitations of evaluation and management by telemedicine. The patient expressed understanding and agreed to proceed.  Vital Signs: Because this visit was a virtual/telehealth visit, some criteria may be missing or patient reported. Any vitals not documented were not able to be obtained and vitals that have been documented are patient reported.  VideoDeclined- This patient declined Librarian, academic. Therefore the visit was completed with audio only.  Persons Participating in Visit: Patient.  AWV Questionnaire: No: Patient Medicare AWV questionnaire was not completed prior to this visit.  Cardiac Risk Factors include: advanced age (>78men, >76 women);dyslipidemia;diabetes mellitus;hypertension     Objective:    Today's Vitals   06/03/23 1538  Weight: 161 lb (73 kg)  Height: 5\' 2"  (1.575 m)   Body mass index is 29.45 kg/m.     06/03/2023    3:49 PM 05/30/2022    3:33 PM 05/28/2021   10:24 AM 07/13/2020   10:15 AM  Advanced Directives  Does Patient Have a Medical Advance Directive? Yes Yes Yes Yes  Type of Estate agent of [place];Living will Healthcare Power of [place];Living will Healthcare Power of [employer] of [place];Living will  Does patient want to make changes to medical advance directive?    No - Patient declined  Copy of Healthcare Power of Attorney in Chart? No - copy requested No - copy requested No - copy requested     Current Medications (verified) Outpatient Encounter Medications as of 06/03/2023  Medication Sig   aspirin EC 81 MG  tablet Take 81 mg by mouth daily. Swallow whole.   atorvastatin (LIPITOR) 40 MG tablet TAKE 1 TABLET MY MOUTH EVERY OTHER DAY FOR CHOLESTEROL   busPIRone (BUSPAR) 15 MG tablet TAKE 1 TABLET TWICE DAILY   citalopram (CELEXA) 40 MG tablet TAKE 1 TABLET EVERY DAY   LORazepam (ATIVAN) 0.5 MG tablet TAKE 1/4 TABLET TO 1 TABLET DAILY AS NEEDED.   metFORMIN (GLUCOPHAGE) 500 MG tablet Take 1 tablet (500 mg total) by mouth daily with breakfast.   metoprolol succinate (TOPROL-XL) 25 MG 24 hr tablet TAKE 1 TABLET EVERY DAY   nystatin ointment (MYCOSTATIN) Apply to affected area daily (Patient not taking: Reported on 06/03/2023)   No facility-administered encounter medications on file as of 06/03/2023.    Allergies (verified) Procaine, Ivp dye [iodinated contrast media], and Sulfa antibiotics   History: Past Medical History:  Diagnosis Date   Allergy    Anxiety disorder    Depression    History of chicken pox    Hyperlipidemia    Hypertension    Tobacco abuse    started at age 55 years   Past Surgical History:  Procedure Laterality Date   ABDOMINAL HYSTERECTOMY  1978   CARDIAC CATHETERIZATION     esohagus stretched     TONSILLECTOMY     Family History  Problem Relation Age of Onset   Diabetes Mother    Heart disease Mother    Coronary artery disease Mother    Dementia Mother    Alcohol abuse Father    Heart attack Father    Depression Sister    Osteoarthritis  Sister    Depression Sister    Prostate cancer Brother    Parkinson's disease Brother    Prostate cancer Brother    Aneurysm Brother    CAD Brother        17 stents   Lung cancer Maternal Grandfather    Social History   Socioeconomic History   Marital status: Widowed    Spouse name: Not on file   Number of children: Not on file   Years of education: Not on file   Highest education level: Not on file  Occupational History   Not on file  Tobacco Use   Smoking status: Every Day    Current packs/day: 1.00     Average packs/day: 1 pack/day for 63.2 years (63.2 ttl pk-yrs)    Types: Cigarettes    Start date: 03/11/1960   Smokeless tobacco: Never  Vaping Use   Vaping status: Never Used  Substance and Sexual Activity   Alcohol use: Never   Drug use: Never   Sexual activity: Not on file  Other Topics Concern   Not on file  Social History Narrative   Moved from [place], [place] to [place] in Oct 2020   Widowed in 2009   3 children   6 grandchildren   Retired Set designer: reading, sleep, 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, Copywriter, advertising   Social Drivers of Health   Financial Resource Strain: Low Risk  (06/03/2023)   Overall Financial Resource Strain (CARDIA)    Difficulty of Paying Living Expenses: Not hard at all  Food Insecurity: No Food Insecurity (06/03/2023)   Hunger Vital Sign    Worried About Running Out of Food in the Last Year: Never true    Ran Out of Food in the Last Year: Never true  Transportation Needs: No Transportation Needs (06/03/2023)   PRAPARE - Administrator, Civil Service (Medical): No    Lack of Transportation (Non-Medical): No  Physical Activity: Insufficiently Active (06/03/2023)   Exercise Vital Sign    Days of Exercise per Week: 7 days    Minutes of Exercise per Session: 10 min  Stress: No Stress Concern Present (06/03/2023)   [employer] of Occupational Health - Occupational Stress Questionnaire    Feeling of Stress : Not at all  Social Connections: Moderately Isolated (06/03/2023)   Social Connection and Isolation Panel [NHANES]    Frequency of Communication with Friends and Family: Twice a week    Frequency of Social Gatherings with Friends and Family: Twice a week    Attends Religious Services: 1 to 4 times per year    Active Member of [employer] or Organizations: No    Attends Banker Meetings: Never    Marital Status: Widowed    Tobacco Counseling Ready to quit: Not Answered Counseling given: Not Answered    Clinical  Intake:  Pre-visit preparation completed: Yes  Pain : No/denies pain     BMI - recorded: 29.45 Nutritional Status: BMI 25 -29 Overweight Nutritional Risks: None Diabetes: Yes CBG done?: No Did pt. bring in CBG monitor from home?: No  Lab Results  Component Value Date   HGBA1C 7.4 (H) 02/18/2023   HGBA1C 6.6 (A) 11/05/2022   HGBA1C 7.2 (H) 06/25/2022     How often do you need to have someone help you when you read instructions, pamphlets, or other written materials from your doctor or pharmacy?: 1 - Never  Interpreter Needed?: No  Information entered by :: Lanier Ensign, LPN  Activities of Daily Living     06/03/2023    3:41 PM  In your present state of health, do you have any difficulty performing the following activities:  Hearing? 0  Vision? 0  Difficulty concentrating or making decisions? 0  Walking or climbing stairs? 1  Comment balance off and leg pain  Dressing or bathing? 0  Doing errands, shopping? 0  Preparing Food and eating ? Y  Comment standing  long  Using the Toilet? N  In the past six months, have you accidently leaked urine? Y  Comment urgency wears pad  Do you have problems with loss of bowel control? N  Managing your Medications? N  Managing your Finances? N  Housekeeping or managing your Housekeeping? N    Patient Care Team: Jarold Motto, [place] as PCP - General (Physician Assistant)  Indicate any recent Medical Services you may have received from other than [facility] providers in the past year (date may be approximate).     Assessment:   This is a routine wellness examination for [place].  Hearing/Vision screen Hearing Screening - Comments:: Pt denies any hearing issues  Vision Screening - Comments:: Pt follows up with [facility] for annual eye exams    Goals Addressed             This Visit's Progress    Patient Stated       Stop smoking if I can        Depression Screen      06/03/2023    3:46 PM 02/18/2023    2:14  PM 11/05/2022    2:10 PM 11/05/2022    2:05 PM 07/02/2022   12:04 PM 05/30/2022    3:30 PM 03/15/2022   11:09 AM  PHQ 2/9 Scores  PHQ - 2 Score 0 2 1 2 2  0 0  PHQ- 9 Score  7 2  5  2     Fall Risk     06/03/2023    3:50 PM 07/02/2022   12:04 PM 05/30/2022    3:34 PM 05/28/2021   10:26 AM  Fall Risk   Falls in the past year? 0 0 0 0  Number falls in past yr: 0 0 0 0  Injury with Fall? 0 0 0 0  Risk for fall due to : Impaired balance/gait;Impaired mobility  Impaired mobility;Impaired balance/gait;Impaired vision Impaired vision;Impaired balance/gait  Follow up Falls prevention discussed Falls evaluation completed Falls prevention discussed Falls prevention discussed    MEDICARE RISK AT HOME:  Medicare Risk at Home Any stairs in or around the home?: Yes If so, are there any without handrails?: No Home free of loose throw rugs in walkways, pet beds, electrical cords, etc?: Yes Adequate lighting in your home to reduce risk of falls?: Yes Life alert?: Yes Use of a cane, walker or w/c?: No Grab bars in the bathroom?: Yes Shower chair or bench in shower?: No Elevated toilet seat or a handicapped toilet?: Yes  TIMED UP AND GO:  Was the test performed?  No  Cognitive Function: 6CIT completed        06/03/2023    3:52 PM 05/30/2022    3:35 PM 05/28/2021   10:28 AM  6CIT Screen  What Year? 0 points 0 points 0 points  What month? 0 points 0 points 0 points  What time? 0 points 0 points 0 points  Count back from 20 0 points 0 points 0 points  Months in reverse 0 points 0 points 0 points  Repeat phrase 0 points 0 points 4 points  Total Score 0 points 0 points 4 points    Immunizations Immunization History  Administered Date(s) Administered   Moderna Sars-Covid-2 Vaccination 03/23/2019, 04/20/2019, 04/14/2020   Pneumococcal Conjugate-13 01/02/2018   Pneumococcal Polysaccharide-23 10/23/2007   Td 12/07/2019    Screening Tests Health Maintenance  Topic Date Due   FOOT EXAM   Never done   Zoster Vaccines- Shingrix (1 of 2) Never done   DEXA SCAN  Never done   INFLUENZA VACCINE  06/09/2023 (Originally 10/10/2022)   OPHTHALMOLOGY EXAM  02/18/2024 (Originally 11/11/1950)   HEMOGLOBIN A1C  08/19/2023   Diabetic kidney evaluation - Urine ACR  11/05/2023   Diabetic kidney evaluation - eGFR measurement  02/18/2024   Medicare Annual Wellness (AWV)  06/02/2024   DTaP/Tdap/Td (2 - Tdap) 12/06/2029   Pneumonia Vaccine 55+ Years old  Completed   HPV VACCINES  Aged Out   COVID-19 Vaccine  Discontinued    Health Maintenance  Health Maintenance Due  Topic Date Due   FOOT EXAM  Never done   Zoster Vaccines- Shingrix (1 of 2) Never done   DEXA SCAN  Never done   Health Maintenance Items Addressed: See Nurse Notes  Additional Screening:  Vision Screening: Recommended annual ophthalmology exams for early detection of glaucoma and other disorders of the eye.  Dental Screening: Recommended annual dental exams for proper oral hygiene  Community Resource Referral / Chronic Care Management: CRR required this visit?  No   CCM required this visit?  No     Plan:     I have personally reviewed and noted the following in the patient's chart:   Medical and social history Use of alcohol, tobacco or illicit drugs  Current medications and supplements including opioid prescriptions. Patient is not currently taking opioid prescriptions. Functional ability and status Nutritional status Physical activity Advanced directives List of other physicians Hospitalizations, surgeries, and ER visits in previous 12 months Vitals Screenings to include cognitive, depression, and falls Referrals and appointments  In addition, I have reviewed and discussed with patient certain preventive protocols, quality metrics, and best practice recommendations. A written personalized care plan for preventive services as well as general preventive health recommendations were provided to patient.      Marzella Schlein, LPN   1/61/0960   After Visit Summary: (In Person-Declined) Patient declined AVS at this time.  Notes: Please refer to Routing Comments.

## 2023-06-03 NOTE — Patient Instructions (Signed)
 Sue Little , Thank you for taking time to come for your Medicare Wellness Visit. I appreciate your ongoing commitment to your health goals. Please review the following plan we discussed and let me know if I can assist you in the future.   Referrals/Orders/Follow-Ups/Clinician Recommendations: If you wish to quit smoking, help is available. For free tobacco cessation program offerings call the [facility] at (952)837-2915 or Live Well Line at (606) 112-2018. You may also visit www.[place].com or email [email or web address] .com for more information on other programs.   You may also call 1-800-QUIT-NOW (914-312-8086) or visit www.[place].ch or www.[facility]ome[facility].org for additional resources on smoking cessation.    Each day, aim for 6 glasses of water, plenty of protein in your diet and try to get up and walk/ stretch every hour for 5-10 minutes at a time.    This is a list of the screening recommended for you and due dates:  Health Maintenance  Topic Date Due   Complete foot exam   Never done   Zoster (Shingles) Vaccine (1 of 2) Never done   DEXA scan (bone density measurement)  Never done   Medicare Annual Wellness Visit  05/30/2023   Flu Shot  06/09/2023*   Eye exam for diabetics  02/18/2024*   Hemoglobin A1C  08/19/2023   Yearly kidney health urinalysis for diabetes  11/05/2023   Yearly kidney function blood test for diabetes  02/18/2024   DTaP/Tdap/Td vaccine (2 - Tdap) 12/06/2029   Pneumonia Vaccine  Completed   HPV Vaccine  Aged Out   COVID-19 Vaccine  Discontinued  *Topic was postponed. The date shown is not the original due date.    Advanced directives: (Copy Requested) Please bring a copy of your health care power of attorney and living will to the office to be added to your chart at your convenience. You can mail to [facility] [address] [place], [place] [address] or email to [email or web address]  Next Medicare Annual Wellness  Visit scheduled for next year: Yes

## 2023-06-03 NOTE — Telephone Encounter (Signed)
 Pt has an appt tomorrow. Lelon Mast is gone for the day.

## 2023-06-04 ENCOUNTER — Encounter: Payer: Self-pay | Admitting: Physician Assistant

## 2023-06-04 ENCOUNTER — Ambulatory Visit: Admitting: Physician Assistant

## 2023-06-04 VITALS — BP 138/80 | HR 57 | Temp 97.0°F | Ht 62.0 in | Wt 157.5 lb

## 2023-06-04 DIAGNOSIS — F419 Anxiety disorder, unspecified: Secondary | ICD-10-CM | POA: Diagnosis not present

## 2023-06-04 DIAGNOSIS — E119 Type 2 diabetes mellitus without complications: Secondary | ICD-10-CM | POA: Diagnosis not present

## 2023-06-04 DIAGNOSIS — I739 Peripheral vascular disease, unspecified: Secondary | ICD-10-CM

## 2023-06-04 DIAGNOSIS — F32A Depression, unspecified: Secondary | ICD-10-CM | POA: Diagnosis not present

## 2023-06-04 DIAGNOSIS — Z7984 Long term (current) use of oral hypoglycemic drugs: Secondary | ICD-10-CM

## 2023-06-04 LAB — POCT GLYCOSYLATED HEMOGLOBIN (HGB A1C): Hemoglobin A1C: 6.5 % — AB (ref 4.0–5.6)

## 2023-06-04 MED ORDER — LORAZEPAM 0.5 MG PO TABS: ORAL_TABLET | ORAL | 2 refills | Status: DC

## 2023-06-04 NOTE — Progress Notes (Signed)
 Sue Little is a 83 y.o. female here for a follow up of a pre-existing problem.  History of Present Illness:   Chief Complaint  Patient presents with   Diabetes    Diabetes  A1c today is 6.5 compared to 7.4 previously.  Reports compliance and good tolerance of metformin 500 mg daily.  She is interested in a referral to diabetes course.  Denies hypoglycemic or hyperglycemic episodes or symptoms.  No further concerns or symptoms at this time.  Anxiety Reports compliance and good tolerance of Ativan quarter of 0.5 mg, daily as needed, Celexa 40 mg daily, and Buspar 15 mg daily. States she feels well overall but does report sleeping alot.  No concerns or symptoms at this time.  Increased heart rate/ PAD She does endorse occasionally experiencing a flutter.   Denies any accompanying chest pain. Reports compliance and good tolerance of aspirin 81 mg and Lipitor 40 mg daily.  She is scheduled to undergo a cardiac workup next week.  She states that she has been walking more often than usual with pain levels remaining the same. Continues to be a smoker.  Past Medical History:  Diagnosis Date   Allergy    Anxiety disorder    Depression    History of chicken pox    Hyperlipidemia    Hypertension    Tobacco abuse    started at age 56 years     Social History   Tobacco Use   Smoking status: Every Day    Current packs/day: 1.00    Average packs/day: 1 pack/day for 63.2 years (63.2 ttl pk-yrs)    Types: Cigarettes    Start date: 03/11/1960   Smokeless tobacco: Never  Vaping Use   Vaping status: Never Used  Substance Use Topics   Alcohol use: Never   Drug use: Never    Past Surgical History:  Procedure Laterality Date   ABDOMINAL HYSTERECTOMY  1978   CARDIAC CATHETERIZATION     esohagus stretched     TONSILLECTOMY      Family History  Problem Relation Age of Onset   Diabetes Mother    Heart disease Mother    Coronary artery disease Mother    Dementia Mother     Alcohol abuse Father    Heart attack Father    Depression Sister    Osteoarthritis Sister    Depression Sister    Prostate cancer Brother    Parkinson's disease Brother    Prostate cancer Brother    Aneurysm Brother    CAD Brother        17 stents   Lung cancer Maternal Grandfather     Allergies  Allergen Reactions   Procaine Other (See Comments)    Hypotension, faint  Can tolerate mepivacaine   Ivp Dye [Iodinated Contrast Media] Hives    swelling   Sulfa Antibiotics Hives    Current Medications:   Current Outpatient Medications:    aspirin EC 81 MG tablet, Take 81 mg by mouth daily. Swallow whole., Disp: , Rfl:    atorvastatin (LIPITOR) 40 MG tablet, TAKE 1 TABLET MY MOUTH EVERY OTHER DAY FOR CHOLESTEROL, Disp: 45 tablet, Rfl: 2   busPIRone (BUSPAR) 15 MG tablet, TAKE 1 TABLET TWICE DAILY, Disp: 180 tablet, Rfl: 3   citalopram (CELEXA) 40 MG tablet, TAKE 1 TABLET EVERY DAY, Disp: 90 tablet, Rfl: 0   LORazepam (ATIVAN) 0.5 MG tablet, TAKE 1/4 TABLET TO 1 TABLET DAILY AS NEEDED., Disp: 30 tablet, Rfl: 2  metFORMIN (GLUCOPHAGE) 500 MG tablet, Take 1 tablet (500 mg total) by mouth daily with breakfast., Disp: 90 tablet, Rfl: 1   metoprolol succinate (TOPROL-XL) 25 MG 24 hr tablet, TAKE 1 TABLET EVERY DAY, Disp: 90 tablet, Rfl: 3   nystatin ointment (MYCOSTATIN), Apply to affected area daily, Disp: 30 g, Rfl: 0   Review of Systems:   Review of Systems  Cardiovascular:  Negative for chest pain.  Negative unless otherwise specified per HPI.   Vitals:   Vitals:   06/04/23 1318  BP: 138/80  Pulse: (!) 57  Temp: (!) 97 F (36.1 C)  TempSrc: Temporal  SpO2: 99%  Weight: 157 lb 8 oz (71.4 kg)  Height: 5\' 2"  (1.575 m)     Body mass index is 28.81 kg/m.  Physical Exam:   Physical Exam Vitals and nursing note reviewed.  Constitutional:      General: She is not in acute distress.    Appearance: She is well-developed. She is not ill-appearing or toxic-appearing.   Cardiovascular:     Rate and Rhythm: Normal rate and regular rhythm.     Pulses: Normal pulses.     Heart sounds: Normal heart sounds, S1 normal and S2 normal.  Pulmonary:     Effort: Pulmonary effort is normal.     Breath sounds: Normal breath sounds.  Skin:    General: Skin is warm and dry.  Neurological:     Mental Status: She is alert.     GCS: GCS eye subscore is 4. GCS verbal subscore is 5. GCS motor subscore is 6.  Psychiatric:        Speech: Speech normal.        Behavior: Behavior normal. Behavior is cooperative.    Results for orders placed or performed in visit on 06/04/23  POCT glycosylated hemoglobin (Hb A1C)  Result Value Ref Range   Hemoglobin A1C 6.5 (A) 4.0 - 5.6 %     Assessment and Plan:   Diabetes mellitus without complication (HCC) Well controlled Continue metformin 500 mg daily Did discuss possible switch to Ozempic given peripheral arterial disease history but she declines Follow up in 6 month(s), sooner if concerns Dietitian referral placed  Anxiety and depression Well controlled Continue Ativan quarter of 0.5 mg, daily as needed, Celexa 40 mg daily, and Buspar 15 mg daily. Follow up in 3-6 month(s), sooner if concerns  PAD (peripheral artery disease) (HCC) Has close follow up with her vascular provider soon Continue efforts at smoking reduction Did discuss possible switch to Ozempic given peripheral arterial disease history but she declines  Recommend cardiology referral but she continues to decline She is getting calcium score completed soon and reports that she may be agreeable to that based on results  Jarold Motto, PA-C  I,Safa M Kadhim,acting as a scribe for [employer], PA.,have documented all relevant documentation on the behalf of Jarold Motto, PA,as directed by  Jarold Motto, PA while in the presence of Jarold Motto, [place].   I, Jarold Motto, [place], have reviewed all documentation for this visit. The documentation on  06/04/23 for the exam, diagnosis, procedures, and orders are all accurate and complete.

## 2023-06-11 ENCOUNTER — Ambulatory Visit: Payer: No Typology Code available for payment source | Admitting: Physician Assistant

## 2023-06-11 ENCOUNTER — Ambulatory Visit (HOSPITAL_COMMUNITY)
Admission: RE | Admit: 2023-06-11 | Discharge: 2023-06-11 | Disposition: A | Discharge status: Home / Self Care | Payer: No Typology Code available for payment source | Source: Ambulatory Visit | Attending: Vascular Surgery | Admitting: Vascular Surgery

## 2023-06-11 VITALS — BP 159/63 | HR 55 | Temp 97.2°F | Ht 62.0 in | Wt 158.4 lb

## 2023-06-11 DIAGNOSIS — I739 Peripheral vascular disease, unspecified: Secondary | ICD-10-CM | POA: Insufficient documentation

## 2023-06-11 LAB — VAS US ABI WITH/WO TBI
Left ABI: 0.47
Right ABI: 0.59

## 2023-06-11 NOTE — Progress Notes (Unsigned)
 Office Note   History of Present Illness   Sue Little is a 83 y.o. (1940/11/15) female who presents for surveillance of PAD. They have a history of ***  The patient returns today for follow up. He/she denies any recent medical history changes. The patient also denies any claudication, rest pain, or tissue loss of the lower extremities.  Current Outpatient Medications  Medication Sig Dispense Refill   aspirin EC 81 MG tablet Take 81 mg by mouth daily. Swallow whole.     atorvastatin (LIPITOR) 40 MG tablet TAKE 1 TABLET MY MOUTH EVERY OTHER DAY FOR CHOLESTEROL 45 tablet 2   busPIRone (BUSPAR) 15 MG tablet TAKE 1 TABLET TWICE DAILY 180 tablet 3   citalopram (CELEXA) 40 MG tablet TAKE 1 TABLET EVERY DAY 90 tablet 0   LORazepam (ATIVAN) 0.5 MG tablet TAKE 1/4 TABLET TO 1 TABLET DAILY AS NEEDED. 30 tablet 2   metFORMIN (GLUCOPHAGE) 500 MG tablet Take 1 tablet (500 mg total) by mouth daily with breakfast. 90 tablet 1   metoprolol succinate (TOPROL-XL) 25 MG 24 hr tablet TAKE 1 TABLET EVERY DAY 90 tablet 3   nystatin ointment (MYCOSTATIN) Apply to affected area daily 30 g 0   No current facility-administered medications for this visit.    ***REVIEW OF SYSTEMS (negative unless checked):   Cardiac:  []  Chest pain or chest pressure? []  Shortness of breath upon activity? []  Shortness of breath when lying flat? []  Irregular heart rhythm?  Vascular:  []  Pain in calf, thigh, or hip brought on by walking? []  Pain in feet at night that wakes you up from your sleep? []  Blood clot in your veins? []  Leg swelling?  Pulmonary:  []  Oxygen at home? []  Productive cough? []  Wheezing?  Neurologic:  []  Sudden weakness in arms or legs? []  Sudden numbness in arms or legs? []  Sudden onset of difficult speaking or slurred speech? []  Temporary loss of vision in one eye? []  Problems with dizziness?  Gastrointestinal:  []  Blood in stool? []  Vomited blood?  Genitourinary:  []  Burning when  urinating? []  Blood in urine?  Psychiatric:  []  Major depression  Hematologic:  []  Bleeding problems? []  Problems with blood clotting?  Dermatologic:  []  Rashes or ulcers?  Constitutional:  []  Fever or chills?  Ear/Nose/Throat:  []  Change in hearing? []  Nose bleeds? []  Sore throat?  Musculoskeletal:  []  Back pain? []  Joint pain? []  Muscle pain?   Physical Examination  ***There were no vitals filed for this visit. ***There is no height or weight on file to calculate BMI.  General:  WDWN in NAD; vital signs documented above Gait: Not observed HENT: WNL, normocephalic Pulmonary: normal non-labored breathing , without rales, rhonchi,  wheezing Cardiac: {Desc; regular/irreg:14544} HR, without murmurs {With/Without:20273} carotid bruit*** Abdomen: soft, NT, no masses Skin: {With/Without:20273} rashes Vascular Exam/Pulses: Palpable/nonpalpable femoral pulses, palpable/nonpalpable popliteal pulses, palpable/nonpalpable pedal pulses. Left DP/PT/Peroneal doppler signals. Right DP/PT/Peroneal doppler signals Extremities: {With/Without:20273} ischemic changes, {With/Without:20273} gangrene , {With/Without:20273} cellulitis; {With/Without:20273} open wounds;  Musculoskeletal: no muscle wasting or atrophy  Neurologic: A&O X 3;  No focal weakness or paresthesias are detected Psychiatric:  The pt has {Desc; normal/abnormal:11317::"Normal"} affect.  Non-Invasive Vascular imaging   ABI (***) R:  ABI: *** (***),  PT: {Signals:19197::"none","mono","bi","tri"} DP: {Signals:19197::"none","mono","bi","tri"} TBI:  *** L:  ABI: *** (***),  PT: {Signals:19197::"none","mono","bi","tri"} DP: {Signals:19197::"none","mono","bi","tri"} TBI: ***  Aortoiliac Duplex (***)   *** Arterial Duplex (***)   Medical Decision Making   Sue Little is a  83 y.o. female who presents for surveillance of PAD  Based on the patient's vascular studies, their ABIs are essentially unchanged since  last visit. *** Arterial duplex *** The patient denies any claudication, rest pain, or tissue loss. They have palpable/nonpalpable pedal pulses with *** doppler signals They will continue their *** and follow up with our office in *** months/year with ABIs and ***   Loel Dubonnet PA-C [facility] Office: [phone]  Clinic MD: ***

## 2023-06-12 ENCOUNTER — Ambulatory Visit (HOSPITAL_BASED_OUTPATIENT_CLINIC_OR_DEPARTMENT_OTHER)
Admission: RE | Admit: 2023-06-12 | Discharge: 2023-06-12 | Disposition: A | Discharge status: Home / Self Care | Payer: Self-pay | Source: Ambulatory Visit | Attending: Physician Assistant | Admitting: Physician Assistant

## 2023-06-12 DIAGNOSIS — E782 Mixed hyperlipidemia: Secondary | ICD-10-CM | POA: Insufficient documentation

## 2023-06-13 ENCOUNTER — Telehealth: Payer: Self-pay | Admitting: *Deleted

## 2023-06-13 NOTE — Telephone Encounter (Signed)
 Copied from CRM [id number]. Topic: Clinical - Lab/Test Results >> Jun 13, 2023  1:21 PM Florestine Avers wrote: Reason for CRM: Patient called in inquiring about her CT cardiac scoring, patient is feeling anxious about it.   Paitent notified, PCP out of the office today We will call with results on Monday  [facility]

## 2023-06-16 NOTE — Telephone Encounter (Signed)
 See result notes.

## 2023-06-16 NOTE — Telephone Encounter (Signed)
 Pt would like a call back for results as soon as possible, she is a little worried about results. Thank you.

## 2023-06-16 NOTE — Telephone Encounter (Signed)
 Lelon Mast pt is calling about CT Cardiac Scoring done 06/12/2023

## 2023-07-14 ENCOUNTER — Telehealth: Payer: Self-pay | Admitting: *Deleted

## 2023-07-14 NOTE — Telephone Encounter (Signed)
 Copied from CRM [id number]. Topic: Clinical - Lab/Test Results >> Jul 14, 2023 12:57 PM Chuck Crater wrote: Reason for CRM: Patient is requesting a callback regarding imaging results. She states that she is having trouble logging in.

## 2023-07-14 NOTE — Telephone Encounter (Signed)
 Please see message and advise

## 2023-07-14 NOTE — Telephone Encounter (Signed)
 Spoke to pt said she is wanting rest of the results from the CT Cardiac score test, is asking about the lung part.

## 2023-07-14 NOTE — Telephone Encounter (Signed)
 Called Radiology and spoke to Vaughn, she said they were waiting on Cardiology to read there part since they have she will escalate reading for the rest of the test to STAT. Told her thank you.

## 2023-07-14 NOTE — Telephone Encounter (Signed)
 Sue Little made aware.

## 2023-07-14 NOTE — Telephone Encounter (Signed)
 Spoke to pt told her I am sending an order over to [facility] in  for the walker you are requesting. Information where [facility] is located and phone number given to pt. Pt verbalized understanding. Told her I will fax order over now and you can call them shortly. Pt verbalized understanding. Order faxed to [facility] at [phone].

## 2023-07-14 NOTE — Telephone Encounter (Signed)
 Copied from CRM [id number]. Topic: General - Other >> Jul 14, 2023 12:53 PM Chuck Crater wrote: Reason for CRM: Patient is requesting a portable walker with seating that can fold up. She states she has graduation this weekend and is having trouble walking far.

## 2023-07-15 ENCOUNTER — Encounter: Payer: Self-pay | Admitting: Physician Assistant

## 2023-08-11 ENCOUNTER — Other Ambulatory Visit: Payer: Self-pay | Admitting: *Deleted

## 2023-08-17 ENCOUNTER — Other Ambulatory Visit: Payer: Self-pay | Admitting: Physician Assistant

## 2023-08-19 ENCOUNTER — Ambulatory Visit (INDEPENDENT_AMBULATORY_CARE_PROVIDER_SITE_OTHER): Payer: Medicare HMO | Admitting: Physician Assistant

## 2023-08-19 ENCOUNTER — Encounter: Payer: Self-pay | Admitting: Physician Assistant

## 2023-08-19 VITALS — BP 134/80 | HR 54 | Temp 98.1°F | Ht 62.0 in | Wt 154.4 lb

## 2023-08-19 DIAGNOSIS — L989 Disorder of the skin and subcutaneous tissue, unspecified: Secondary | ICD-10-CM

## 2023-08-19 DIAGNOSIS — Z7984 Long term (current) use of oral hypoglycemic drugs: Secondary | ICD-10-CM

## 2023-08-19 DIAGNOSIS — I10 Essential (primary) hypertension: Secondary | ICD-10-CM | POA: Diagnosis not present

## 2023-08-19 DIAGNOSIS — F32A Depression, unspecified: Secondary | ICD-10-CM | POA: Diagnosis not present

## 2023-08-19 DIAGNOSIS — F419 Anxiety disorder, unspecified: Secondary | ICD-10-CM

## 2023-08-19 DIAGNOSIS — I739 Peripheral vascular disease, unspecified: Secondary | ICD-10-CM | POA: Diagnosis not present

## 2023-08-19 DIAGNOSIS — E119 Type 2 diabetes mellitus without complications: Secondary | ICD-10-CM | POA: Diagnosis not present

## 2023-08-19 DIAGNOSIS — E785 Hyperlipidemia, unspecified: Secondary | ICD-10-CM | POA: Diagnosis not present

## 2023-08-19 NOTE — Patient Instructions (Signed)
 It was great to see you!  -Continue to stay on the cholesterol medication -Drink more water  We will refer you to a different dermatologist  Take care,  Arkeem Harts PA-C

## 2023-08-19 NOTE — Progress Notes (Signed)
 Sue Little is a 83 y.o. female here for a follow up of a pre-existing problem.  History of Present Illness:   Chief Complaint  Patient presents with   Medical Management of Chronic Issues    Diabetes, Hypertension, Hyperlipidemia    Pt is accompanied by her daughter.   Diabetes: Overall well controlled.  Pt is compliant with Metformin  500 once daily.  Endorses occasional nausea, otherwise is tolerating well. Pt has a hx of constipation.  States she has not been drinking a sufficient amount of water daily.   HTN: Compliant with Toprol -XL 25 mg daily. Tolerating well with no side effects.  Has been walking regularly. Pt continues to smoke, but does not have any motivation to quit.  No acute concerns today.  HLD:  Complaint with Atorvastatin  40 mg every other day.  States she cannot feel the clinical effect. 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 No follow up was required.  Pt reports her heart flutters have also improved since having Ca scoring.  Has not been evaluated by cardiology.  She notes she wore a heart monitor about 10+ years ago.   Anxiety / Depression: Moods are overall stable.  Taking Buspar  15 mg BID, Celexa  40 mg daily, and Ativan  0.125 mg daily PRN.  Tolerating well with no side effects.  She expresses fear of slipping while showering and notes it has become a barrier to proper personal hygiene.  She has a tub she showers in.   Peripheral arterial disease She has a history of peripheral artery disease and is currently seeing [facility] every 6 months She denies any current claudication symptoms  Skin lesions She is interested in a different dermatology referral  Past Medical History:  Diagnosis Date   Allergy    Anxiety disorder    Depression    History of chicken pox    Hyperlipidemia    Hypertension    Tobacco abuse    started at age 40 years     Social History   Tobacco Use   Smoking status:  Every Day    Current packs/day: 1.00    Average packs/day: 1 pack/day for 63.4 years (63.4 ttl pk-yrs)    Types: Cigarettes    Start date: 03/11/1960   Smokeless tobacco: Never  Vaping Use   Vaping status: Never Used  Substance Use Topics   Alcohol use: Never   Drug use: Never    Past Surgical History:  Procedure Laterality Date   ABDOMINAL HYSTERECTOMY  1978   CARDIAC CATHETERIZATION     esohagus stretched     TONSILLECTOMY      Family History  Problem Relation Age of Onset   Diabetes Mother    Heart disease Mother    Coronary artery disease Mother    Dementia Mother    Alcohol abuse Father    Heart attack Father    Depression Sister    Osteoarthritis Sister    Depression Sister    Prostate cancer Brother    Parkinson's disease Brother    Prostate cancer Brother    Aneurysm Brother    CAD Brother        17 stents   Lung cancer Maternal Grandfather     Allergies  Allergen Reactions   Procaine Other (See Comments)    Hypotension, faint  Can tolerate mepivacaine   Ivp Dye [Iodinated Contrast Media] Hives    swelling   Sulfa Antibiotics Hives  Current Medications:   Current Outpatient Medications:    aspirin EC 81 MG tablet, Take 81 mg by mouth daily. Swallow whole., Disp: , Rfl:    atorvastatin  (LIPITOR) 40 MG tablet, TAKE 1 TABLET MY MOUTH EVERY OTHER DAY FOR CHOLESTEROL, Disp: 45 tablet, Rfl: 2   busPIRone  (BUSPAR ) 15 MG tablet, TAKE 1 TABLET TWICE DAILY, Disp: 180 tablet, Rfl: 3   citalopram  (CELEXA ) 40 MG tablet, TAKE 1 TABLET EVERY DAY, Disp: 90 tablet, Rfl: 0   LORazepam  (ATIVAN ) 0.5 MG tablet, TAKE 1/4 TABLET TO 1 TABLET DAILY AS NEEDED., Disp: 30 tablet, Rfl: 2   metFORMIN  (GLUCOPHAGE ) 500 MG tablet, Take 1 tablet (500 mg total) by mouth daily with breakfast., Disp: 90 tablet, Rfl: 1   metoprolol  succinate (TOPROL -XL) 25 MG 24 hr tablet, TAKE 1 TABLET EVERY DAY, Disp: 90 tablet, Rfl: 3   nystatin  ointment (MYCOSTATIN ), Apply to affected area daily,  Disp: 30 g, Rfl: 0   Review of Systems:   Negative unless otherwise specified per HPI.  Vitals:   Vitals:   08/19/23 1324  BP: 134/80  Pulse: (!) 54  Temp: 98.1 F (36.7 C)  TempSrc: Temporal  SpO2: 98%  Weight: 154 lb 6.1 oz (70 kg)  Height: 5\' 2"  (1.575 m)     Body mass index is 28.24 kg/m.  Physical Exam:   Physical Exam Vitals and nursing note reviewed.  Constitutional:      General: She is not in acute distress.    Appearance: She is well-developed. She is not ill-appearing or toxic-appearing.  Cardiovascular:     Rate and Rhythm: Normal rate and regular rhythm.     Pulses: Normal pulses.     Heart sounds: Normal heart sounds, S1 normal and S2 normal.  Pulmonary:     Effort: Pulmonary effort is normal.     Breath sounds: Normal breath sounds.  Skin:    General: Skin is warm and dry.  Neurological:     Mental Status: She is alert.     GCS: GCS eye subscore is 4. GCS verbal subscore is 5. GCS motor subscore is 6.  Psychiatric:        Speech: Speech normal.        Behavior: Behavior normal. Behavior is cooperative.     Assessment and Plan:   1. Diabetes mellitus without complication (HCC) (Primary) It is too soon to recheck her A1c today We did discuss that she would be eligible to transition to a GLP-1 for her diabetes and PAD however she does deal with significant constipation and she is concerned about this possibly worsening For now we will keep her on metformin  500 mg daily and follow-up in 6 months to check in on progress of medication and A1c at that time  2. Anxiety and depression Currently well-controlled on Celexa  40 mg daily, BuSpar  15 mg twice daily and Ativan  0.5 mg daily as needed Denies suicidal ideation/hi Follow-up in 6 months, sooner if concerns  3. Hyperlipidemia, unspecified hyperlipidemia type - Ambulatory referral to Cardiology She is tolerating her statin 40 mg every other day and taking aspirin 81 mg daily  4. Essential  hypertension - Ambulatory referral to Cardiology Blood pressure is currently well-controlled with metoprolol  25 mg extended release daily  5. PAD (peripheral artery disease) (HCC) She is potentially considering Pletal soon and was recommended to have cardiology evaluation to assess for this I am going to go ahead and referral for her to have this done She is asked me  multiple times throughout the years about her ongoing palpitations and intermittent chest pain and would like formal cardiology evaluation at this time  I, Bernita Bristle, acting as a Neurosurgeon for Alexander Iba, [place]., have documented all relevant documentation on the behalf of Alexander Iba, [place], as directed by   while in the presence of Alexander Iba, [place].  I, Alexander Iba, [place], have reviewed all documentation for this visit. The documentation on 08/19/23 for the exam, diagnosis, procedures, and orders are all accurate and complete.  Alexander Iba, PA-C

## 2023-09-11 DIAGNOSIS — H25043 Posterior subcapsular polar age-related cataract, bilateral: Secondary | ICD-10-CM | POA: Diagnosis not present

## 2023-09-11 DIAGNOSIS — E119 Type 2 diabetes mellitus without complications: Secondary | ICD-10-CM | POA: Diagnosis not present

## 2023-09-11 DIAGNOSIS — H1045 Other chronic allergic conjunctivitis: Secondary | ICD-10-CM | POA: Diagnosis not present

## 2023-09-11 DIAGNOSIS — H52223 Regular astigmatism, bilateral: Secondary | ICD-10-CM | POA: Diagnosis not present

## 2023-09-11 DIAGNOSIS — H35033 Hypertensive retinopathy, bilateral: Secondary | ICD-10-CM | POA: Diagnosis not present

## 2023-09-11 DIAGNOSIS — H5213 Myopia, bilateral: Secondary | ICD-10-CM | POA: Diagnosis not present

## 2023-09-11 DIAGNOSIS — H524 Presbyopia: Secondary | ICD-10-CM | POA: Diagnosis not present

## 2023-09-11 DIAGNOSIS — H25013 Cortical age-related cataract, bilateral: Secondary | ICD-10-CM | POA: Diagnosis not present

## 2023-09-11 DIAGNOSIS — H2513 Age-related nuclear cataract, bilateral: Secondary | ICD-10-CM | POA: Diagnosis not present

## 2023-09-11 LAB — HM DIABETES EYE EXAM

## 2023-10-23 ENCOUNTER — Other Ambulatory Visit: Payer: Self-pay

## 2023-10-23 DIAGNOSIS — I739 Peripheral vascular disease, unspecified: Secondary | ICD-10-CM

## 2023-10-30 ENCOUNTER — Other Ambulatory Visit: Payer: Self-pay | Admitting: Physician Assistant

## 2023-10-30 MED ORDER — CITALOPRAM HYDROBROMIDE 40 MG PO TABS: ORAL_TABLET | ORAL | 1 refills | Status: DC

## 2023-10-30 MED ORDER — METFORMIN HCL 500 MG PO TABS: 500.0000 mg | ORAL_TABLET | Freq: Every day | ORAL | 1 refills | Status: DC

## 2023-10-30 MED ORDER — BUSPIRONE HCL 15 MG PO TABS: ORAL_TABLET | ORAL | 1 refills | Status: DC

## 2023-10-30 MED ORDER — METOPROLOL SUCCINATE ER 25 MG PO TB24
25.0000 mg | ORAL_TABLET | Freq: Every day | ORAL | 1 refills | Status: DC
Start: 2023-10-30 — End: 2023-12-02

## 2023-10-30 MED ORDER — LORAZEPAM 0.5 MG PO TABS: ORAL_TABLET | ORAL | 2 refills | Status: DC

## 2023-10-30 MED ORDER — ATORVASTATIN CALCIUM 40 MG PO TABS: ORAL_TABLET | ORAL | 1 refills | Status: DC
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
## 2023-11-17 ENCOUNTER — Other Ambulatory Visit: Payer: Self-pay | Admitting: Physician Assistant

## 2023-11-17 NOTE — Telephone Encounter (Unsigned)
 Copied from CRM [id number]. Topic: Clinical - Medication Refill >> Nov 17, 2023  5:52 PM Dedra B wrote: Medication: busPIRone  (BUSPAR ) 15 MG tablet  Has the patient contacted their pharmacy? Yes was told that pick up expired and she needs a new prescription   This is the patient's preferred pharmacy:  [facility] [address] [place], [place] - [place] [address] [place] AT [facility] [address] [place] [address] Phone: [phone] Fax: [phone]  Is this the correct pharmacy for this prescription? Yes   Has the prescription been filled recently? Yes, but patient was unable to pick it up due to not moving to the area and was told that it expired and she needs a new prescription.   Is the patient out of the medication? Yes  Has the patient been seen for an appointment in the last year OR does the patient have an upcoming appointment? Yes  Can we respond through MyChart? Yes  Agent: Please be advised that Rx refills may take up to 3 business days. We ask that you follow-up with your pharmacy.

## 2023-11-18 MED ORDER — BUSPIRONE HCL 15 MG PO TABS: ORAL_TABLET | ORAL | 1 refills | Status: AC

## 2023-11-26 ENCOUNTER — Other Ambulatory Visit: Payer: Self-pay | Admitting: Physician Assistant

## 2023-12-01 ENCOUNTER — Other Ambulatory Visit: Payer: Self-pay | Admitting: Physician Assistant

## 2023-12-01 NOTE — Telephone Encounter (Signed)
 Left message on voicemail to call office.

## 2023-12-01 NOTE — Telephone Encounter (Signed)
 Pt requesting refill for Lorazepam  0.5 mg. Last OV 08/2023.

## 2023-12-01 NOTE — Telephone Encounter (Unsigned)
 Copied from CRM [id number]. Topic: Clinical - Medication Refill >> Dec 01, 2023  9:32 AM Suzen RAMAN wrote: Medication: atorvastatin  (LIPITOR) 40 MG tablet metoprolol  succinate (TOPROL -XL) 25 MG 24 hr tablet metFORMIN  (GLUCOPHAGE ) 500 MG tablet LORazepam  (ATIVAN ) 0.5 MG tablet   Has the patient contacted their pharmacy? Yes  This is the patient's preferred pharmacy:  [facility] [address] [place], [place] - [place] [address] [place] [facility] [address] [place] [address] Phone: [phone] Fax: [phone]  Is this the correct pharmacy for this prescription? Yes If no, delete pharmacy and type the correct one.   Has the prescription been filled recently? Yes; metformin  was sent to the wrong pharmacy per patient but the others were not recently filled  Is the patient out of the medication? Yes, Atorvastatin   Has the patient been seen for an appointment in the last year OR does the patient have an upcoming appointment? Yes  Can we respond through MyChart? Yes  Agent: Please be advised that Rx refills may take up to 3 business days. We ask that you follow-up with your pharmacy.

## 2023-12-02 ENCOUNTER — Telehealth: Payer: Self-pay | Admitting: *Deleted

## 2023-12-02 ENCOUNTER — Other Ambulatory Visit: Payer: Self-pay | Admitting: Physician Assistant

## 2023-12-02 MED ORDER — METOPROLOL SUCCINATE ER 25 MG PO TB24: 25.0000 mg | ORAL_TABLET | Freq: Every day | ORAL | 1 refills | Status: DC

## 2023-12-02 MED ORDER — ATORVASTATIN CALCIUM 40 MG PO TABS: ORAL_TABLET | ORAL | 1 refills | Status: AC

## 2023-12-02 MED ORDER — LORAZEPAM 0.5 MG PO TABS: ORAL_TABLET | ORAL | 0 refills | Status: DC

## 2023-12-02 MED ORDER — METFORMIN HCL 500 MG PO TABS: 500.0000 mg | ORAL_TABLET | Freq: Every day | ORAL | 1 refills | Status: AC
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
## 2023-12-02 NOTE — Telephone Encounter (Signed)
 Noted

## 2023-12-02 NOTE — Telephone Encounter (Signed)
 Copied from CRM #[id number]. Topic: General - Other >> Dec 01, 2023  4:29 PM Rosina BIRCH wrote: Reason for CRM: patient returning a call to [place] and she did move to [facility] [phone]

## 2023-12-03 ENCOUNTER — Ambulatory Visit (HOSPITAL_COMMUNITY)

## 2023-12-03 ENCOUNTER — Ambulatory Visit

## 2023-12-10 ENCOUNTER — Ambulatory Visit: Admitting: Physician Assistant

## 2023-12-11 ENCOUNTER — Encounter: Payer: Self-pay | Admitting: Physician Assistant

## 2023-12-16 NOTE — Progress Notes (Signed)
 Sue Little                                          MRN: [id number]   12/16/2023   The [facility] Quality Team Specialist reviewed this patient medical record for the purposes of chart review for care gap closure. The following were reviewed: chart review for care gap closure-kidney health evaluation for diabetes:eGFR  and uACR.    [facility] Quality Team

## 2024-01-14 ENCOUNTER — Other Ambulatory Visit: Payer: Self-pay | Admitting: Physician Assistant

## 2024-01-21 ENCOUNTER — Ambulatory Visit (HOSPITAL_BASED_OUTPATIENT_CLINIC_OR_DEPARTMENT_OTHER): Admitting: Cardiology

## 2024-01-30 ENCOUNTER — Other Ambulatory Visit: Payer: Self-pay | Admitting: Physician Assistant

## 2024-02-02 NOTE — Telephone Encounter (Signed)
 Pt requesting refill for Lorazepam  0.5 mg. Last OV 6/20205.

## 2024-02-16 ENCOUNTER — Telehealth: Payer: Self-pay | Admitting: Physician Assistant

## 2024-02-16 NOTE — Telephone Encounter (Unsigned)
 Copied from CRM [id number]. Topic: Clinical - Medication Refill >> Feb 16, 2024  9:34 AM Eva FALCON wrote: Medication:  LORazepam  (ATIVAN ) 0.5 MG tablet    Has the patient contacted their pharmacy? Yes, shows that Lucie Buttner, [place] [facility] [place].  (Agent: If no, request that the patient contact the pharmacy for the refill. If patient does not wish to contact the pharmacy document the reason why and proceed with request.) (Agent: If yes, when and what did the pharmacy advise?)  This is the patient's preferred pharmacy:  [id number] [place], [place] - [address] [phone]  Is this the correct pharmacy for this prescription? Yes If no, delete pharmacy and type the correct one.   Has the prescription been filled recently? No  Is the patient out of the medication? No  Has the patient been seen for an appointment in the last year OR does the patient have an upcoming appointment? Yes  Can we respond through MyChart? No, prefer phone call   Agent: Please be advised that Rx refills may take up to 3 business days. We ask that you follow-up with your pharmacy.

## 2024-02-17 NOTE — Telephone Encounter (Signed)
 Last OV: 08/19/23  Next OV: none  Last filled: 02/02/24  Quantity: 30

## 2024-02-19 ENCOUNTER — Other Ambulatory Visit: Payer: Self-pay

## 2024-02-19 NOTE — Telephone Encounter (Unsigned)
 Copied from CRM #[id number]. Topic: Clinical - Prescription Issue >> Feb 19, 2024 11:29 AM Nessti S wrote: Reason for CRM: pt called in because LORazepam  (ATIVAN ) 0.5 MG tablet was sent to the incorrect pharmacy. She would like it transferred to  [id number] [place], [place] - [address] [place] [address] Phone: [phone] Fax: [phone]

## 2024-02-19 NOTE — Telephone Encounter (Signed)
 Please see message, pharmacy has been corrected.

## 2024-02-20 ENCOUNTER — Other Ambulatory Visit: Payer: Self-pay | Admitting: *Deleted

## 2024-02-20 MED ORDER — LORAZEPAM 0.5 MG PO TABS: ORAL_TABLET | ORAL | 0 refills | Status: DC

## 2024-02-20 NOTE — Telephone Encounter (Signed)
 Left message on voicemail Rx sent to pharmacy and I am sending a My Chart message.

## 2024-02-20 NOTE — Telephone Encounter (Signed)
 Please see message and fill enough for one month. Lorazepam  0.5 mg tablet.

## 2024-02-20 NOTE — Telephone Encounter (Signed)
 Copied from CRM #[id number]. Topic: Clinical - Prescription Issue >> Feb 20, 2024  9:18 AM Vena HERO wrote: Reason for CRM: Pt called in because she had a refill for LORazepam  (ATIVAN ) 0.5 MG tablet filled but it went to the wrong pharmacy. [facility] [place]. She does not have enough meds to get through the weekend. Please call pt to advise of completion

## 2024-02-23 ENCOUNTER — Telehealth: Payer: Self-pay | Admitting: *Deleted
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
## 2024-02-25 ENCOUNTER — Encounter: Payer: Self-pay | Admitting: Physician Assistant

## 2024-02-25 ENCOUNTER — Telehealth: Admitting: Physician Assistant

## 2024-02-25 VITALS — Ht 62.0 in | Wt 144.0 lb

## 2024-02-25 DIAGNOSIS — F32A Depression, unspecified: Secondary | ICD-10-CM

## 2024-02-25 DIAGNOSIS — Z720 Tobacco use: Secondary | ICD-10-CM | POA: Diagnosis not present

## 2024-02-25 DIAGNOSIS — F419 Anxiety disorder, unspecified: Secondary | ICD-10-CM

## 2024-02-25 MED ORDER — LORAZEPAM 0.5 MG PO TABS: 0.2500 mg | ORAL_TABLET | Freq: Every day | ORAL | 0 refills | Status: AC

## 2024-02-25 MED ORDER — CITALOPRAM HYDROBROMIDE 20 MG PO TABS: 20.0000 mg | ORAL_TABLET | Freq: Every day | ORAL | Status: AC
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
## 2024-03-06 ENCOUNTER — Other Ambulatory Visit: Payer: Self-pay | Admitting: Physician Assistant

## 2024-06-03 ENCOUNTER — Ambulatory Visit
